# Patient Record
Sex: Female | Born: 1970 | Race: White | Hispanic: No | Marital: Single | Smoking: Former smoker
Health system: Southern US, Community
[De-identification: ages and names within clinical notes are randomized; demographics above are authoritative.]

## PROBLEM LIST (undated history)

## (undated) DIAGNOSIS — J45909 Unspecified asthma, uncomplicated: Secondary | ICD-10-CM

## (undated) HISTORY — DX: Unspecified asthma, uncomplicated: J45.909

---

## 2008-12-06 ENCOUNTER — Ambulatory Visit (HOSPITAL_COMMUNITY): Admission: RE | Admit: 2008-12-06 | Discharge: 2008-12-06 | Payer: Self-pay | Admitting: Obstetrics and Gynecology

## 2009-06-19 ENCOUNTER — Inpatient Hospital Stay (HOSPITAL_COMMUNITY): Admission: RE | Admit: 2009-06-19 | Discharge: 2009-06-21 | Payer: Self-pay | Admitting: Obstetrics and Gynecology

## 2010-02-11 IMAGING — US US OB COMP LESS 14 WK
1 series · 11 of 11 positions shown · non-contrast
Comparison: none

OBSTETRICAL ULTRASOUND:
 This ultrasound was performed in The [HOSPITAL], and the [facility] report will be stored to [REDACTED] PACS.

[Series 1: us ob comp less 14 wk · 11 of 11 slices shown]
[im 1/11]
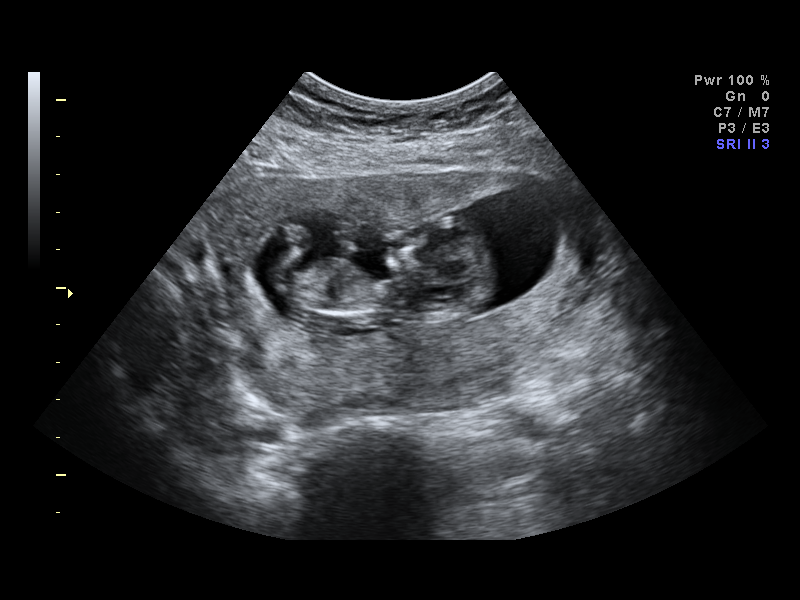
[im 2/11]
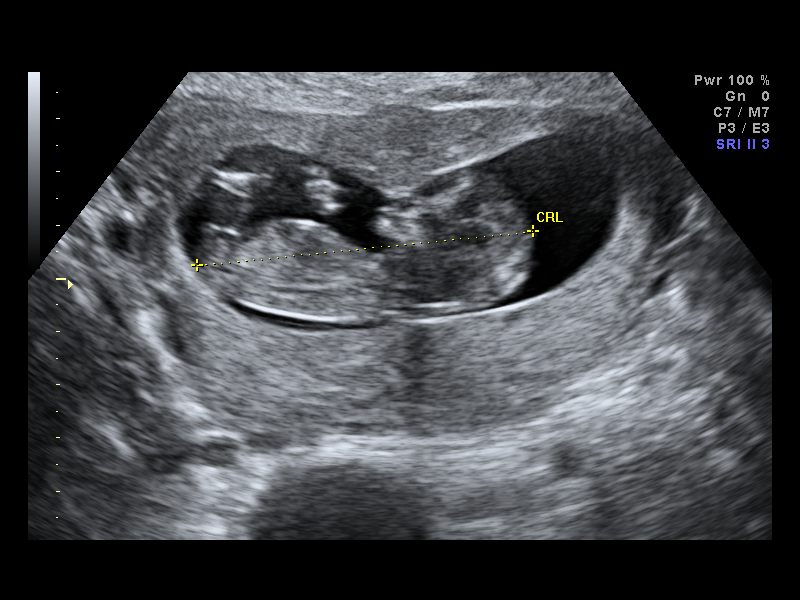
[im 3/11]
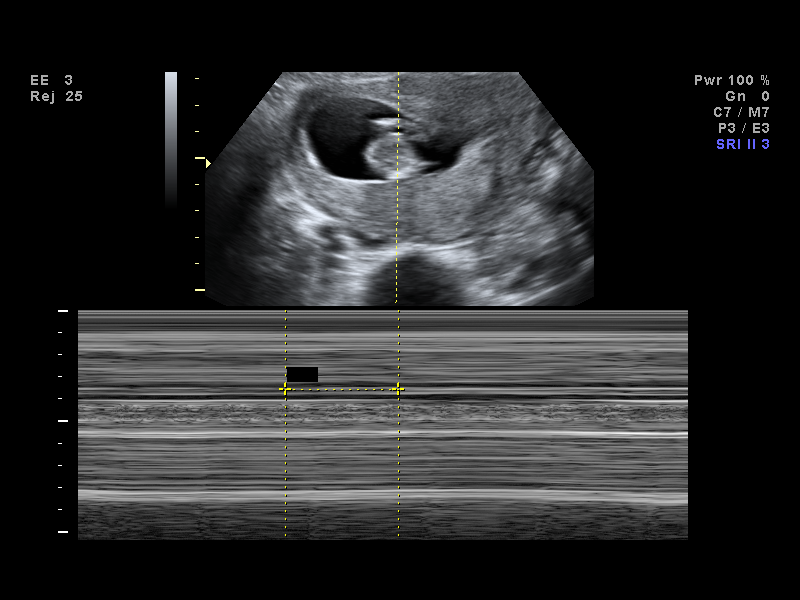
[im 4/11]
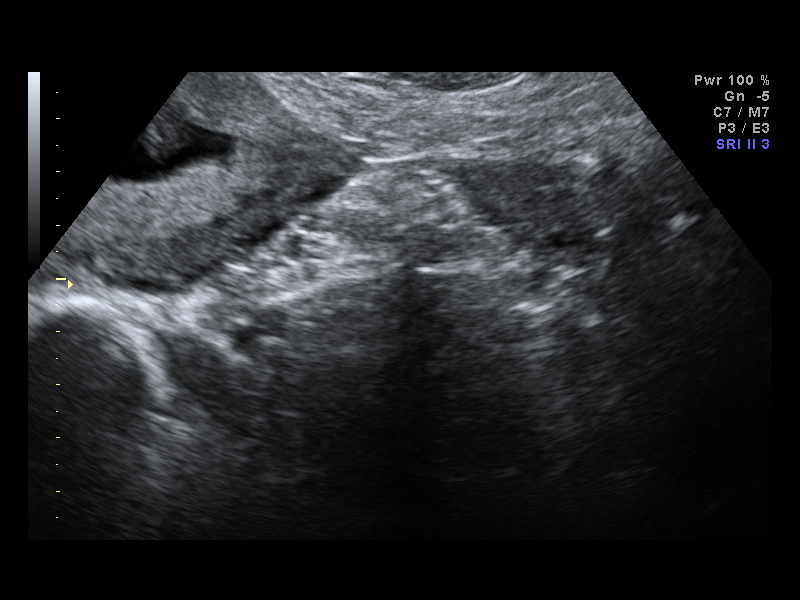
[im 5/11]
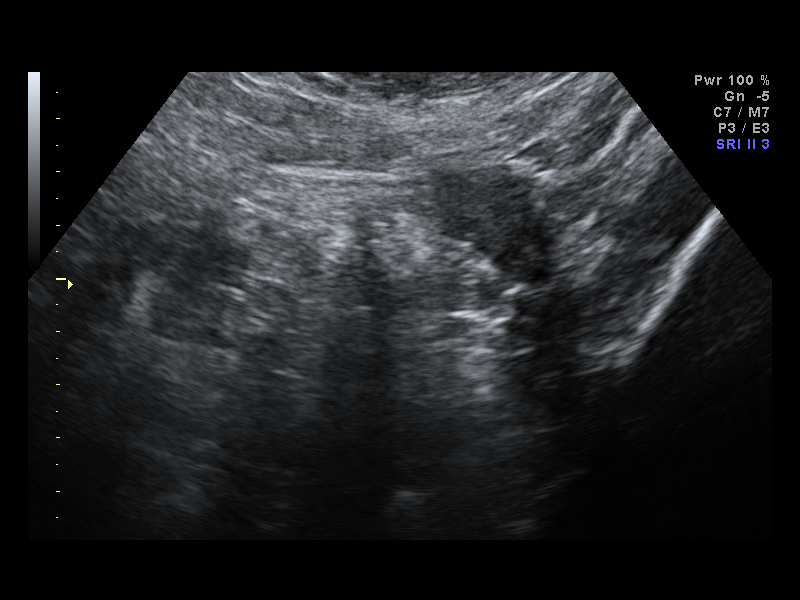
[im 6/11]
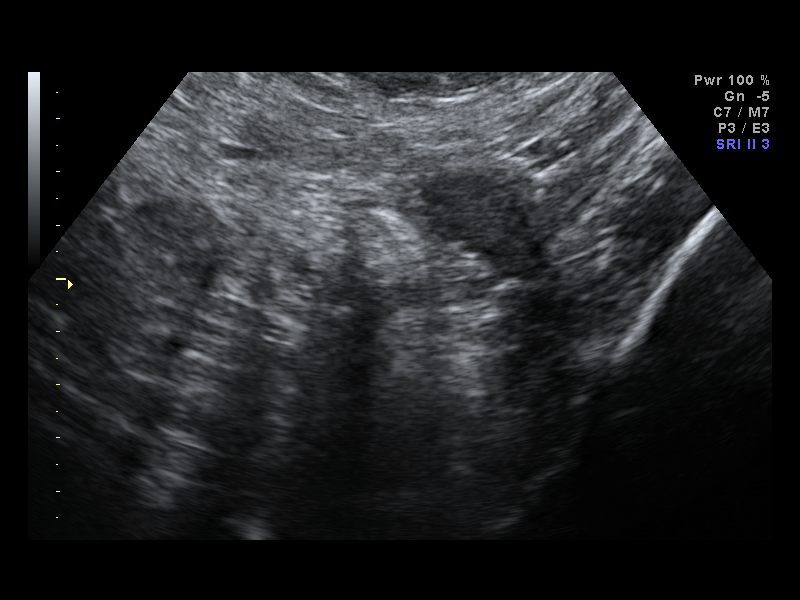
[im 7/11]
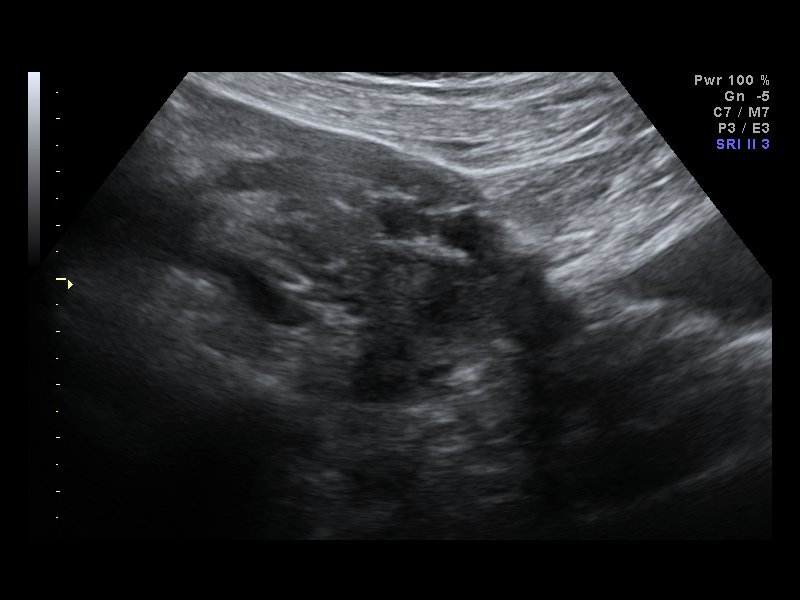
[im 8/11]
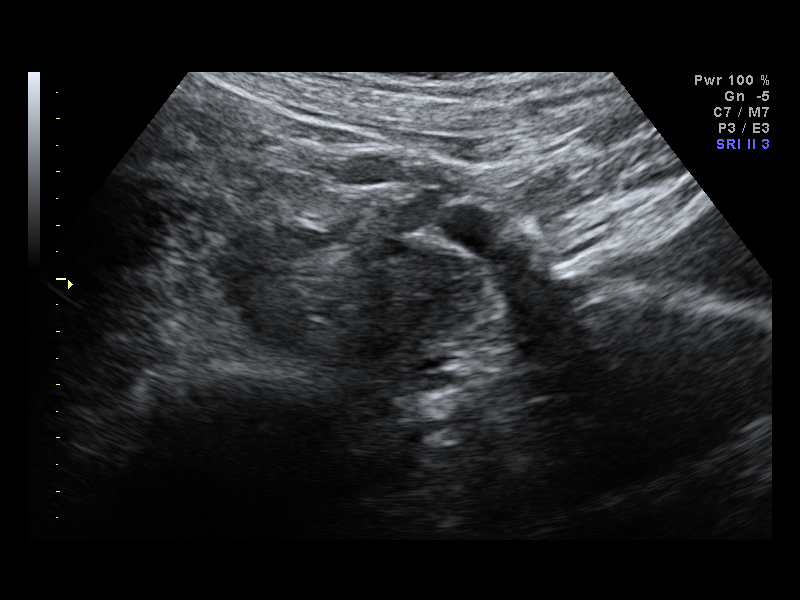
[im 9/11]
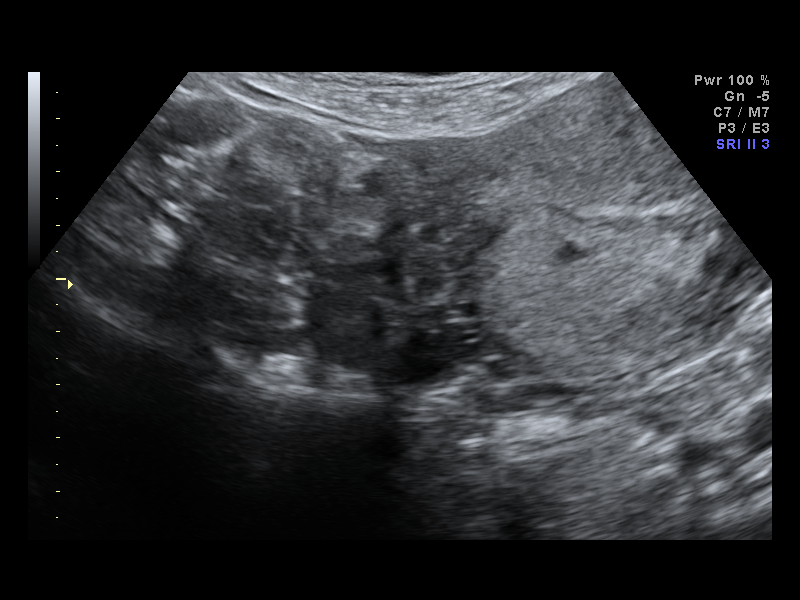
[im 10/11]
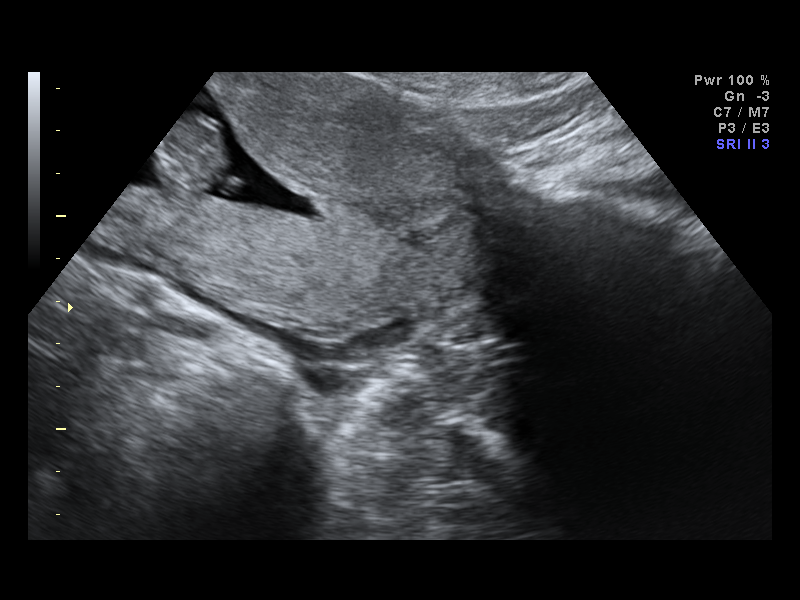
[im 11/11]
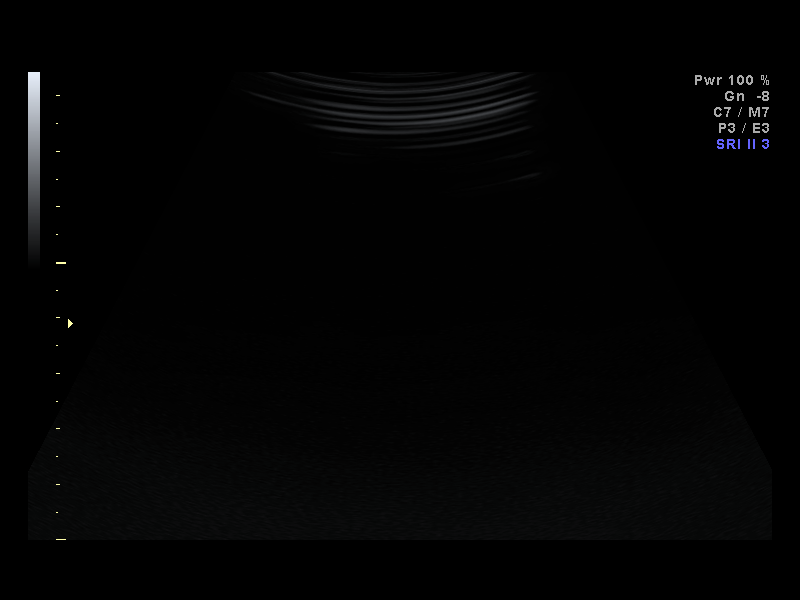

[11 of 11 positions shown; findings below may reference images not displayed]

IMPRESSION: [facility] has also been faxed to the ordering physician.

## 2010-09-01 ENCOUNTER — Encounter: Payer: Self-pay | Admitting: Obstetrics and Gynecology

## 2010-09-02 ENCOUNTER — Encounter: Payer: Self-pay | Admitting: Obstetrics and Gynecology

## 2010-11-13 LAB — CBC
HCT: 32 % — ABNORMAL LOW (ref 36.0–46.0)
Hemoglobin: 9.6 g/dL — ABNORMAL LOW (ref 12.0–15.0)
Platelets: 281 10*3/uL (ref 150–400)
RBC: 3.06 MIL/uL — ABNORMAL LOW (ref 3.87–5.11)
WBC: 10.6 10*3/uL — ABNORMAL HIGH (ref 4.0–10.5)
WBC: 16.8 10*3/uL — ABNORMAL HIGH (ref 4.0–10.5)

## 2010-11-13 LAB — RPR: RPR Ser Ql: NONREACTIVE

## 2015-04-02 DIAGNOSIS — J309 Allergic rhinitis, unspecified: Secondary | ICD-10-CM | POA: Insufficient documentation

## 2015-04-02 DIAGNOSIS — J45909 Unspecified asthma, uncomplicated: Secondary | ICD-10-CM | POA: Insufficient documentation

## 2015-05-15 ENCOUNTER — Ambulatory Visit (INDEPENDENT_AMBULATORY_CARE_PROVIDER_SITE_OTHER): Payer: BLUE CROSS/BLUE SHIELD | Admitting: Pediatrics

## 2015-05-15 ENCOUNTER — Encounter: Payer: Self-pay | Admitting: Pediatrics

## 2015-05-15 VITALS — BP 106/80 | HR 84 | Temp 98.0°F | Resp 16 | Ht 64.57 in | Wt 189.2 lb

## 2015-05-15 DIAGNOSIS — J3089 Other allergic rhinitis: Secondary | ICD-10-CM | POA: Diagnosis not present

## 2015-05-15 DIAGNOSIS — J4530 Mild persistent asthma, uncomplicated: Secondary | ICD-10-CM | POA: Diagnosis not present

## 2015-05-15 MED ORDER — MONTELUKAST SODIUM 10 MG PO TABS: 10.0000 mg | ORAL_TABLET | Freq: Every day | ORAL | Status: DC

## 2015-05-15 MED ORDER — ALBUTEROL SULFATE HFA 108 (90 BASE) MCG/ACT IN AERS: 2.0000 | INHALATION_SPRAY | RESPIRATORY_TRACT | Status: DC | PRN

## 2015-05-15 MED ORDER — FLUTICASONE PROPIONATE 50 MCG/ACT NA SUSP: 2.0000 | Freq: Every day | NASAL | Status: DC

## 2015-05-15 MED ORDER — BECLOMETHASONE DIPROPIONATE 80 MCG/ACT IN AERS: 2.0000 | INHALATION_SPRAY | Freq: Every day | RESPIRATORY_TRACT | Status: DC

## 2015-05-15 NOTE — Assessment & Plan Note (Signed)
Continue on current treatment plan  Follow-up visit in one year but she will call me if she is not doing well on this treatment plan

## 2015-05-15 NOTE — Assessment & Plan Note (Signed)
Continue on the current treatment plan.

## 2015-05-15 NOTE — Progress Notes (Signed)
FOLLOW UP NOTE  RE: Alin Hutchins MRN: [id number] DOB: Nov 17, 1970 [facility] [place] [address] [place] [address] Date of Office Visit: 05/15/2015   Chief Complaint: Cough and Wheezing     HPI the patient has done well in the past year with regard to her asthma and allergic rhinitis. For her asthma she uses Qvar 80 one puff once a day but she increases it to twice a day if she has to use her albuterol inhaler more than 2 days per week. She is now tolerating montelukast 10 mg once a day. She does not want a flu shot  Drug Allergies:  No Active Allergies   Physical Exam: BP 106/80 mmHg  Pulse 84  Temp(Src) 98 F (36.7 C) (Oral)  Resp 16  Ht 5' 4.57" (1.64 m)  Wt 189 lb 2.5 oz (85.8 kg)  BMI 31.90 kg/m2  Physical Exam  Constitutional: She appears well-developed and well-nourished.  HENT:  Right Ear: Tympanic membrane normal.  Left Ear: Tympanic membrane normal.  Nose: Nose normal.  Mouth/Throat: Oropharynx is clear and moist.  Eyes: Conjunctivae are normal.  Neck: Neck supple. No thyromegaly present.  Cardiovascular: Normal rate, regular rhythm and normal heart sounds.   No murmur heard. Pulmonary/Chest: Effort normal and breath sounds normal.  Lymphadenopathy:    She has no cervical adenopathy.  Neurological: She is alert.  Psychiatric: She has a normal mood and affect. Her behavior is normal.     Diagnostics:   Forced vital capacity 2.78 L FEV1 2.04 L predicted forced vital capacity 3.78 L predicted FEV1 3.05 L-the spirometry shows a mild reduction in the forced vital capacity and in the FEV1 percent   Assessment and Plan:  Mild persistent asthma Continue on the current treatment plan.  Other allergic rhinitis Continue on current treatment plan  Follow-up visit in one year but she will call me if she is not doing well on this treatment plan    Return in about 1 year (around  05/14/2016).  Thank you for the opportunity to care for this patient.  Please do not hesitate to contact me with questions.  [facility] [address] [place], [place] [address] [phone]  J. Posey Rea, M.D.

## 2015-05-15 NOTE — Patient Instructions (Signed)
Mild persistent asthma Continue on the current treatment plan.  Other allergic rhinitis Continue on current treatment plan  Follow-up visit in one year but she will call me if she is not doing well on this treatment plan   Return in about 1 year (around 05/14/2016).

## 2016-05-07 ENCOUNTER — Other Ambulatory Visit: Payer: Self-pay | Admitting: Allergy

## 2016-05-08 ENCOUNTER — Other Ambulatory Visit: Payer: Self-pay | Admitting: Allergy

## 2016-05-08 MED ORDER — ALBUTEROL SULFATE HFA 108 (90 BASE) MCG/ACT IN AERS: 2.0000 | INHALATION_SPRAY | RESPIRATORY_TRACT | 1 refills | Status: DC | PRN

## 2016-05-28 ENCOUNTER — Other Ambulatory Visit: Payer: Self-pay | Admitting: Allergy

## 2016-05-28 MED ORDER — FLUTICASONE PROPIONATE 50 MCG/ACT NA SUSP: 2.0000 | Freq: Every day | NASAL | 0 refills | Status: DC

## 2016-05-28 MED ORDER — MONTELUKAST SODIUM 10 MG PO TABS: 10.0000 mg | ORAL_TABLET | Freq: Every day | ORAL | 0 refills | Status: DC

## 2016-06-17 ENCOUNTER — Ambulatory Visit (INDEPENDENT_AMBULATORY_CARE_PROVIDER_SITE_OTHER): Payer: BLUE CROSS/BLUE SHIELD | Admitting: Pediatrics

## 2016-06-17 ENCOUNTER — Encounter: Payer: Self-pay | Admitting: Pediatrics

## 2016-06-17 VITALS — BP 108/82 | HR 78 | Temp 97.8°F | Resp 20

## 2016-06-17 DIAGNOSIS — J3089 Other allergic rhinitis: Secondary | ICD-10-CM | POA: Diagnosis not present

## 2016-06-17 DIAGNOSIS — J4540 Moderate persistent asthma, uncomplicated: Secondary | ICD-10-CM

## 2016-06-17 MED ORDER — MONTELUKAST SODIUM 10 MG PO TABS: 10.0000 mg | ORAL_TABLET | Freq: Every day | ORAL | 5 refills | Status: DC

## 2016-06-17 MED ORDER — ALBUTEROL SULFATE HFA 108 (90 BASE) MCG/ACT IN AERS: 2.0000 | INHALATION_SPRAY | RESPIRATORY_TRACT | 2 refills | Status: DC | PRN

## 2016-06-17 MED ORDER — BECLOMETHASONE DIPROPIONATE 80 MCG/ACT IN AERS: 1.0000 | INHALATION_SPRAY | Freq: Two times a day (BID) | RESPIRATORY_TRACT | 5 refills | Status: DC

## 2016-06-17 NOTE — Progress Notes (Signed)
[address] [place] [address] Dept: [phone]  FOLLOW UP NOTE  Patient ID: Deanna Rubio, female    DOB: 08/01/1971  Age: 45 y.o. MRN: [id number] Date of Office Visit: 06/17/2016  Assessment  Chief Complaint: Allergic Rhinitis ; Asthma; Cough (x's 2-3 weeks); Wheezing (intermittently x's 2-3 weeks); and Nasal Congestion (no color)  HPI Deanna Rubio presents for follow-up of asthma and allergic rhinitis. Her symptoms are worse in the fall of the year. She recently she increased Qvar 80 to  one puff twice a day from one puff once a. She is having mild nasal congestion. She does not want a flu vaccination.  Current medications cetirizine 10 mg once a day,  fluticasone 1 spray per nostril twice a day if needed, montelukast 10 mg once a day Qvar 80-one puff twice a day,  Pro-air 2 puffs every 4 hours if needed. Her other medications are outlined in the chart   Drug Allergies:  No Active Allergies  Physical Exam: BP 108/82 (BP Location: Left Arm, Patient Position: Sitting, Cuff Size: Normal)   Pulse 78   Temp 97.8 F (36.6 C) (Oral)   Resp 20   SpO2 96%    Physical Exam  Constitutional: She is oriented to person, place, and time. She appears well-developed and well-nourished.  HENT:  Eyes normal. Ears normal. Nose normal. Pharynx normal.  Neck: Neck supple.  Cardiovascular:  S1 and S2 normal no murmurs  Pulmonary/Chest:  Clear to percussion and auscultation  Lymphadenopathy:    She has no cervical adenopathy.  Neurological: She is alert and oriented to person, place, and time.  Psychiatric: She has a normal mood and affect. Her behavior is normal. Judgment and thought content normal.  Vitals reviewed.   Diagnostics:  FVC 2.57 L FEV1 2.01 L. Predicted FVC 3.76 L predicted FEV1 3.03 L-this shows a mild reduction in the forced vital capacity  Assessment and Plan: 1. Moderate persistent asthma without complication   2. Other allergic rhinitis     Meds ordered  this encounter  Medications  . beclomethasone (QVAR) 80 MCG/ACT inhaler    Sig: Inhale 1 puff into the lungs 2 (two) times daily.    Dispense:  1 Inhaler    Refill:  5  . albuterol (PROAIR HFA) 108 (90 Base) MCG/ACT inhaler    Sig: Inhale 2 puffs into the lungs every 4 (four) hours as needed for wheezing or shortness of breath.    Dispense:  1 g    Refill:  2    On hold, pt will call  . montelukast (SINGULAIR) 10 MG tablet    Sig: Take 1 tablet (10 mg total) by mouth at bedtime.    Dispense:  30 tablet    Refill:  5    On hold, pt will call    Patient Instructions  Zyrtec 10 mg once a day for runny nose Fluticasone one or 2 sprays per nostril once a day for stuffy nose Qvar 80-1 puff twice a day to prevent coughing or wheezing Pro-air 2 puffs every 4 hours if needed for wheezing or coughing spells Montelukast 10 mg once a day for coughing or wheezing Call me if you are not doing better on this treatment plan Continue on your other medications   Return in about 1 year (around 06/17/2017).    Thank you for the opportunity to care for this patient.  Please do not hesitate to contact me with questions.  Tonette BihariJ. A. Puja Caffey, M.D.  [facility] [address] [place], [place] [address] [phone]

## 2016-06-17 NOTE — Patient Instructions (Addendum)
Zyrtec 10 mg once a day for runny nose Fluticasone one or 2 sprays per nostril once a day for stuffy nose Qvar 80-1 puff twice a day to prevent coughing or wheezing Pro-air 2 puffs every 4 hours if needed for wheezing or coughing spells Montelukast 10 mg once a day for coughing or wheezing Call me if you are not doing better on this treatment plan Continue on your other medications

## 2017-01-22 ENCOUNTER — Other Ambulatory Visit: Payer: Self-pay

## 2017-01-22 MED ORDER — ALBUTEROL SULFATE HFA 108 (90 BASE) MCG/ACT IN AERS: 2.0000 | INHALATION_SPRAY | RESPIRATORY_TRACT | 0 refills | Status: DC | PRN

## 2017-01-22 NOTE — Telephone Encounter (Signed)
Last seen 06/2016. One courtesy refill given today. Will add note to be seen in office for further refills.

## 2017-03-19 ENCOUNTER — Other Ambulatory Visit: Payer: Self-pay | Admitting: *Deleted

## 2017-03-19 MED ORDER — ALBUTEROL SULFATE HFA 108 (90 BASE) MCG/ACT IN AERS: 2.0000 | INHALATION_SPRAY | RESPIRATORY_TRACT | 2 refills | Status: DC | PRN

## 2017-03-19 NOTE — Telephone Encounter (Signed)
According to doctor note, pt is to return in November 2018. Refilled Rx for proair hfa.

## 2017-05-12 ENCOUNTER — Telehealth: Payer: Self-pay | Admitting: Allergy

## 2017-05-12 ENCOUNTER — Other Ambulatory Visit: Payer: Self-pay | Admitting: Allergy

## 2017-05-12 MED ORDER — FLUTICASONE PROPIONATE HFA 110 MCG/ACT IN AERO: 1.0000 | INHALATION_SPRAY | Freq: Two times a day (BID) | RESPIRATORY_TRACT | 5 refills | Status: DC

## 2017-05-12 NOTE — Telephone Encounter (Signed)
Left message for patient that we changed her Q-Var to Flovent HFA 110 mcg

## 2017-06-22 ENCOUNTER — Other Ambulatory Visit: Payer: Self-pay | Admitting: Allergy

## 2017-06-22 MED ORDER — MONTELUKAST SODIUM 10 MG PO TABS: 10.0000 mg | ORAL_TABLET | Freq: Every day | ORAL | 0 refills | Status: DC

## 2017-07-13 ENCOUNTER — Other Ambulatory Visit: Payer: Self-pay

## 2017-07-13 NOTE — Telephone Encounter (Signed)
Denied refill Proair.  Pt last OV 06/17/16.  Patient was given one refill on 01/22/17 and told to make OV.  Also on 03/19/17 was informed to make OV.  NO OV MADE.

## 2017-07-15 ENCOUNTER — Other Ambulatory Visit: Payer: Self-pay | Admitting: Allergy

## 2017-07-15 MED ORDER — ALBUTEROL SULFATE HFA 108 (90 BASE) MCG/ACT IN AERS: 2.0000 | INHALATION_SPRAY | RESPIRATORY_TRACT | 0 refills | Status: DC | PRN

## 2017-07-27 ENCOUNTER — Ambulatory Visit: Payer: BLUE CROSS/BLUE SHIELD | Admitting: Pediatrics

## 2017-07-27 ENCOUNTER — Encounter: Payer: Self-pay | Admitting: Pediatrics

## 2017-07-27 VITALS — BP 122/74 | HR 82 | Temp 97.8°F | Resp 16 | Ht 64.5 in | Wt 196.0 lb

## 2017-07-27 DIAGNOSIS — J4540 Moderate persistent asthma, uncomplicated: Secondary | ICD-10-CM | POA: Diagnosis not present

## 2017-07-27 DIAGNOSIS — J3089 Other allergic rhinitis: Secondary | ICD-10-CM

## 2017-07-27 MED ORDER — ALBUTEROL SULFATE HFA 108 (90 BASE) MCG/ACT IN AERS: 2.0000 | INHALATION_SPRAY | RESPIRATORY_TRACT | 1 refills | Status: DC | PRN

## 2017-07-27 MED ORDER — FLUTICASONE PROPIONATE 50 MCG/ACT NA SUSP: NASAL | 5 refills | Status: DC

## 2017-07-27 MED ORDER — MONTELUKAST SODIUM 10 MG PO TABS: 10.0000 mg | ORAL_TABLET | Freq: Every day | ORAL | 5 refills | Status: DC

## 2017-07-27 MED ORDER — FLUTICASONE PROPIONATE HFA 110 MCG/ACT IN AERO: INHALATION_SPRAY | RESPIRATORY_TRACT | 5 refills | Status: DC

## 2017-07-27 NOTE — Patient Instructions (Signed)
Zyrtec 10 mg once a day if needed for runny nose Fluticasone 2 sprays per nostril once a day if needed for stuffy nose Flovent 110 one puff twice a day to prevent coughing or wheezing Pro-air 2 puffs every 4 hours if needed for wheezing or coughing spells Montelukast  10 mg once a day for coughing or wheezing Complete the course of Augmentin for the  otitis media Call us if you are not doing well on this treatment plan

## 2017-07-27 NOTE — Progress Notes (Signed)
  [address] [place] [address] Dept: [phone]  FOLLOW UP NOTE  Patient ID: Deanna PrimusShawnee Rubio, female    DOB: 11/26/1970  Age: 46 y.o. MRN: [id number] Date of Office Visit: 07/27/2017  Assessment  Chief Complaint: Asthma and Allergies  HPI Deanna PrimusShawnee Riden presents for follow-up of asthma and allergic rhinitis. She was treated for otitis media one week ago with Augmentin and a Medrol Dosepak. Her symptoms have improved. Her asthma has been well controlled. She has been having mild nasal congestion.  Current medications are outlined in the chart   Drug Allergies:  No Known Allergies  Physical Exam: BP 122/74   Pulse 82   Temp 97.8 F (36.6 C) (Oral)   Resp 16   Ht 5' 4.5" (1.638 m)   Wt 196 lb (88.9 kg)   SpO2 95%   BMI 33.12 kg/m    Physical Exam  Constitutional: She is oriented to person, place, and time. She appears well-developed and well-nourished.  HENT:  Eyes normal. Ears normal. Nose normal. Pharynx normal.  Neck: Neck supple. No thyromegaly present.  Cardiovascular:  S1 and S2 normal no murmurs  Pulmonary/Chest:  Clear to percussion and auscultation  Neurological: She is alert and oriented to person, place, and time.  Psychiatric: She has a normal mood and affect. Her behavior is normal. Judgment and thought content normal.  Vitals reviewed.   Diagnostics:  FVC 2.56 L FEV1 1.88 L. Predicted FVC 3.75 L predicted FEV1 3.01 L-this shows a mild reduction in the forced vital capacity  Assessment and Plan: 1. Moderate persistent asthma without complication   2. Other allergic rhinitis     Meds ordered this encounter  Medications  . fluticasone (FLONASE) 50 MCG/ACT nasal spray    Sig: Two sprays each nostril once a day for nasal congestion or drainage.    Dispense:  16 g    Refill:  5  . fluticasone (FLOVENT HFA) 110 MCG/ACT inhaler    Sig: One puff twice a day to prevent cough or wheeze. Rinse, gargle and spit after use.    Dispense:  1 Inhaler      Refill:  5  . albuterol (PROAIR HFA) 108 (90 Base) MCG/ACT inhaler    Sig: Inhale 2 puffs into the lungs every 4 (four) hours as needed for wheezing or shortness of breath.    Dispense:  1 Inhaler    Refill:  1  . montelukast (SINGULAIR) 10 MG tablet    Sig: Take 1 tablet (10 mg total) by mouth at bedtime.    Dispense:  30 tablet    Refill:  5    Patient Instructions  Zyrtec 10 mg once a day if needed for runny nose Fluticasone 2 sprays per nostril once a day if needed for stuffy nose Flovent 110 one puff twice a day to prevent coughing or wheezing Pro-air 2 puffs every 4 hours if needed for wheezing or coughing spells Montelukast  10 mg once a day for coughing or wheezing Complete the course of Augmentin for the  otitis media Call us if you are not doing well on this treatment plan   Return in about 1 year (around 07/27/2018).    Thank you for the opportunity to care for this patient.  Please do not hesitate to contact me with questions.  Tonette BihariJ. A. Tacy Chavis, M.D.  [facility] [address] [place], [place] [address] [phone]

## 2017-09-22 ENCOUNTER — Other Ambulatory Visit: Payer: Self-pay

## 2017-09-22 MED ORDER — ALBUTEROL SULFATE HFA 108 (90 BASE) MCG/ACT IN AERS: 2.0000 | INHALATION_SPRAY | RESPIRATORY_TRACT | 1 refills | Status: DC | PRN

## 2018-05-24 ENCOUNTER — Other Ambulatory Visit: Payer: Self-pay | Admitting: Allergy

## 2018-05-24 MED ORDER — ALBUTEROL SULFATE HFA 108 (90 BASE) MCG/ACT IN AERS: 2.0000 | INHALATION_SPRAY | RESPIRATORY_TRACT | 5 refills | Status: DC | PRN

## 2018-07-27 ENCOUNTER — Ambulatory Visit: Payer: BLUE CROSS/BLUE SHIELD | Admitting: Pediatrics

## 2018-08-02 ENCOUNTER — Ambulatory Visit: Payer: BLUE CROSS/BLUE SHIELD | Admitting: Pediatrics

## 2018-08-10 ENCOUNTER — Ambulatory Visit: Payer: BLUE CROSS/BLUE SHIELD | Admitting: Pediatrics

## 2018-08-10 ENCOUNTER — Encounter: Payer: Self-pay | Admitting: Pediatrics

## 2018-08-10 VITALS — BP 122/66 | HR 71 | Temp 97.6°F | Resp 16 | Ht 64.5 in | Wt 202.2 lb

## 2018-08-10 DIAGNOSIS — J3089 Other allergic rhinitis: Secondary | ICD-10-CM | POA: Diagnosis not present

## 2018-08-10 DIAGNOSIS — E66811 Obesity, class 1: Secondary | ICD-10-CM

## 2018-08-10 DIAGNOSIS — E669 Obesity, unspecified: Secondary | ICD-10-CM | POA: Diagnosis not present

## 2018-08-10 DIAGNOSIS — J4540 Moderate persistent asthma, uncomplicated: Secondary | ICD-10-CM

## 2018-08-10 MED ORDER — FLUTICASONE PROPIONATE 50 MCG/ACT NA SUSP: 2.0000 | Freq: Every day | NASAL | 5 refills | Status: DC | PRN

## 2018-08-10 MED ORDER — ALBUTEROL SULFATE HFA 108 (90 BASE) MCG/ACT IN AERS: 2.0000 | INHALATION_SPRAY | RESPIRATORY_TRACT | 2 refills | Status: DC | PRN

## 2018-08-10 MED ORDER — MONTELUKAST SODIUM 10 MG PO TABS: 10.0000 mg | ORAL_TABLET | Freq: Every day | ORAL | 5 refills | Status: DC

## 2018-08-10 MED ORDER — FLUTICASONE PROPIONATE HFA 110 MCG/ACT IN AERO: INHALATION_SPRAY | RESPIRATORY_TRACT | 5 refills | Status: DC

## 2018-08-10 NOTE — Patient Instructions (Signed)
Zyrtec 10 mg-take 1 tablet once a day if needed for runny nose or itchy eyes Fluticasone- 2 sprays per nostril once a day if needed for stuffy nose Flovent 110-1 puff twice a day to prevent coughing or wheezing Montelukast 10 mg-take 1 tablet once a day to prevent coughing or wheezing Pro-air 2 puffs every 4 hours if needed for wheezing or coughing spells.  You may use Pro-air 2 puffs 5 to 15 minutes before exercise Call us if you are not doing well on this treatment plan

## 2018-08-10 NOTE — Progress Notes (Signed)
[address] [place] [id number] Dept: [phone]  FOLLOW UP NOTE  Patient ID: Deanna Rubio Lippman, female    DOB: 11/01/1970  Age: 47 y.o. MRN: [id number] Date of Office Visit: 08/10/2018  Assessment  Chief Complaint: Asthma and Allergies  HPI Deanna Rubio Monks presents for  follow-up of asthma and allergic rhinitis.  She does not believe in flu vaccinations.  Her asthma has been well controlled with the use of Flovent 110 -1 puff twice a day and montelukast 10 mg once a day.  She is not having to take medications on a daily basis for her nasal symptoms   Drug Allergies:  No Known Allergies  Physical Exam: BP 122/66   Pulse 71   Temp 97.6 F (36.4 C) (Oral)   Resp 16   Ht 5' 4.5" (1.638 m)   Wt 202 lb 3.2 oz (91.7 kg)   SpO2 95%   BMI 34.17 kg/m    Physical Exam Vitals signs reviewed.  Constitutional:      Appearance: Normal appearance. She is obese.  HENT:     Head:     Comments: Eyes normal.  Ears normal.  Nose normal.  Pharynx normal. Neck:     Musculoskeletal: Neck supple.  Cardiovascular:     Comments: S1-S2 normal no murmurs Pulmonary:     Comments: Clear to percussion and auscultation Lymphadenopathy:     Cervical: No cervical adenopathy.  Neurological:     General: No focal deficit present.     Mental Status: She is alert and oriented to person, place, and time.  Psychiatric:        Mood and Affect: Mood normal.        Behavior: Behavior normal.        Thought Content: Thought content normal.        Judgment: Judgment normal.     Diagnostics: FVC 2.62 L FEV1 2.12 L.  Predicted FVC 3.73 L predicted FEV1 2.99 L this shows a mild reduction in the forced vital capacity but stable for her  Assessment and Plan: 1. Moderate persistent asthma without complication   2. Other allergic rhinitis   3. Obesity (BMI 30.0-34.9)     Meds ordered this encounter  Medications  . montelukast (SINGULAIR) 10 MG tablet    Sig: Take 1 tablet (10 mg total) by mouth  at bedtime.    Dispense:  30 tablet    Refill:  5  . fluticasone (FLOVENT HFA) 110 MCG/ACT inhaler    Sig: One puff twice a day to prevent cough or wheeze. Rinse, gargle and spit after use.    Dispense:  1 Inhaler    Refill:  5  . fluticasone (FLONASE) 50 MCG/ACT nasal spray    Sig: Place 2 sprays into both nostrils daily as needed (for stuffy nose).    Dispense:  16 g    Refill:  5  . albuterol (PROAIR HFA) 108 (90 Base) MCG/ACT inhaler    Sig: Inhale 2 puffs into the lungs every 4 (four) hours as needed for wheezing or shortness of breath.    Dispense:  1 Inhaler    Refill:  2    Patient Instructions  Zyrtec 10 mg-take 1 tablet once a day if needed for runny nose or itchy eyes Fluticasone- 2 sprays per nostril once a day if needed for stuffy nose Flovent 110-1 puff twice a day to prevent coughing or wheezing Montelukast 10 mg-take 1 tablet once a day to prevent coughing or wheezing Pro-air 2  puffs every 4 hours if needed for wheezing or coughing spells.  You may use Pro-air 2 puffs 5 to 15 minutes before exercise Call us if you are not doing well on this treatment plan   Return in about 1 year (around 08/11/2019).    Thank you for the opportunity to care for this patient.  Please do not hesitate to contact me with questions.  Tonette BihariJ. A. Destenie Ingber, M.D.  [facility] [address] [place], [place] [address] [phone]

## 2018-10-25 ENCOUNTER — Other Ambulatory Visit: Payer: Self-pay

## 2018-10-25 MED ORDER — MONTELUKAST SODIUM 10 MG PO TABS: 10.0000 mg | ORAL_TABLET | Freq: Every day | ORAL | 5 refills | Status: DC

## 2019-04-21 ENCOUNTER — Other Ambulatory Visit: Payer: Self-pay | Admitting: Pediatrics

## 2019-05-27 ENCOUNTER — Other Ambulatory Visit: Payer: Self-pay | Admitting: *Deleted

## 2019-05-27 MED ORDER — ALBUTEROL SULFATE HFA 108 (90 BASE) MCG/ACT IN AERS: 2.0000 | INHALATION_SPRAY | RESPIRATORY_TRACT | 0 refills | Status: DC | PRN

## 2019-05-27 NOTE — Telephone Encounter (Signed)
Received fax from pharmacy proair not covered- preferred is ventolin. Sent 1 ventolin no refills until ov.

## 2019-06-28 ENCOUNTER — Other Ambulatory Visit: Payer: Self-pay | Admitting: Pediatrics

## 2019-07-13 ENCOUNTER — Other Ambulatory Visit: Payer: Self-pay | Admitting: Pediatrics

## 2019-08-03 ENCOUNTER — Other Ambulatory Visit: Payer: Self-pay | Admitting: Pediatrics

## 2019-08-10 ENCOUNTER — Other Ambulatory Visit: Payer: Self-pay

## 2019-08-11 ENCOUNTER — Other Ambulatory Visit: Payer: Self-pay

## 2019-08-15 ENCOUNTER — Other Ambulatory Visit: Payer: Self-pay

## 2019-08-16 ENCOUNTER — Other Ambulatory Visit: Payer: Self-pay

## 2019-08-16 ENCOUNTER — Ambulatory Visit: Payer: BC Managed Care – PPO | Admitting: Pediatrics

## 2019-08-16 ENCOUNTER — Encounter: Payer: Self-pay | Admitting: Pediatrics

## 2019-08-16 VITALS — BP 110/82 | HR 82 | Temp 96.7°F | Resp 16 | Ht 65.0 in | Wt 206.4 lb

## 2019-08-16 DIAGNOSIS — J4540 Moderate persistent asthma, uncomplicated: Secondary | ICD-10-CM

## 2019-08-16 DIAGNOSIS — J3089 Other allergic rhinitis: Secondary | ICD-10-CM | POA: Diagnosis not present

## 2019-08-16 DIAGNOSIS — E669 Obesity, unspecified: Secondary | ICD-10-CM | POA: Diagnosis not present

## 2019-08-16 MED ORDER — ALBUTEROL SULFATE HFA 108 (90 BASE) MCG/ACT IN AERS: INHALATION_SPRAY | RESPIRATORY_TRACT | 1 refills | Status: DC

## 2019-08-16 MED ORDER — MONTELUKAST SODIUM 10 MG PO TABS: ORAL_TABLET | ORAL | 5 refills | Status: DC

## 2019-08-16 MED ORDER — FLOVENT HFA 110 MCG/ACT IN AERO: INHALATION_SPRAY | RESPIRATORY_TRACT | 5 refills | Status: DC

## 2019-08-16 NOTE — Progress Notes (Signed)
[address] [place] [address] Dept: [phone]  FOLLOW UP NOTE  Patient ID: Deanna Rubio, female    DOB: February 07, 1971  Age: 49 y.o. MRN: [id number] Date of Office Visit: 08/16/2019  Assessment  Chief Complaint: Asthma  HPI [name] presents for follow-up of asthma and allergic rhinitis.  Her asthma is well controlled by using Flovent 110-2 puffs once a day and montelukast 10 mg once a day.  She very rarely has to use albuterol. If she has a runny nose she uses Zyrtec 10 mg once a day and if she has a stuffy nose she uses fluticasone 2 sprays per nostril once a day   Drug Allergies:  No Known Allergies  Physical Exam: BP 110/82   Pulse 82   Temp (!) 96.7 F (35.9 C) (Temporal)   Resp 16   Ht 5\' 5"  (1.651 m)   Wt 206 lb 6.4 oz (93.6 kg)   SpO2 97%   BMI 34.35 kg/m    Physical Exam Constitutional:      Appearance: Normal appearance. She is obese.  HENT:     Head:     Comments: Eyes normal.  Ears normal.  Nose normal.  Pharynx normal. Cardiovascular:     Comments: S1-S2 normal no murmurs Pulmonary:     Comments: Clear to percussion and auscultation Musculoskeletal:     Cervical back: Neck supple.  Lymphadenopathy:     Cervical: No cervical adenopathy.  Neurological:     General: No focal deficit present.     Mental Status: She is alert and oriented to person, place, and time.  Psychiatric:        Mood and Affect: Mood normal.        Behavior: Behavior normal.        Thought Content: Thought content normal.        Judgment: Judgment normal.     Diagnostics: FVC 2.68 L FEV1 2.12 L.  Predicted FVC 3.71 L predicted FEV1 2.96 L-this shows a mild reduction in the forced vital capacity  Assessment and Plan: 1. Moderate persistent asthma without complication   2. Other allergic rhinitis   3. Obesity (BMI 30.0-34.9)     Meds ordered this encounter  Medications  . albuterol (VENTOLIN HFA) 108 (90 Base) MCG/ACT inhaler    Sig: INHALE 2 PUFFS INTO  THE LUNGS EVERY 4 HOURS AS NEEDED FOR WHEEZING OR SHORTNESS OF BREATH    Dispense:  18 g    Refill:  1    Pt needs ov for any additional refills  . fluticasone (FLOVENT HFA) 110 MCG/ACT inhaler    Sig: Two  puff once  a day to prevent cough or wheeze. Rinse, gargle and spit after use.    Dispense:  1 Inhaler    Refill:  5  . montelukast (SINGULAIR) 10 MG tablet    Sig: TAKE 1 TABLET(10 MG) BY MOUTH AT BEDTIME. NEED TO MAKE OFFICE VISIT FOR MORE REFILLS    Dispense:  30 tablet    Refill:  5    Patient Instructions  Zyrtec 10 mg-take 1 tablet once a day if needed for runny nose or itchy eyes Fluticasone 2 sprays per nostril once a day if needed for stuffy nose  Flovent 110-2 puffs once a day to prevent coughing or wheezing Montelukast 10 mg-take 1 tablet once a day to prevent coughing or wheezing Ventolin 2 puffs every 4 hours if needed for wheezing or coughing spells.  You may use Ventolin 2 puffs  5 to 15 minutes before exercise  Continue on your other medications Call us if you are not doing well on this treatment plan   Return in about 1 year (around 08/15/2020).    Thank you for the opportunity to care for this patient.  Please do not hesitate to contact me with questions.  Tonette Bihari, M.D.  [facility] [address] [place], [place] [address] [phone]

## 2019-08-16 NOTE — Patient Instructions (Signed)
Zyrtec 10 mg-take 1 tablet once a day if needed for runny nose or itchy eyes Fluticasone 2 sprays per nostril once a day if needed for stuffy nose  Flovent 110-2 puffs once a day to prevent coughing or wheezing Montelukast 10 mg-take 1 tablet once a day to prevent coughing or wheezing Ventolin 2 puffs every 4 hours if needed for wheezing or coughing spells.  You may use Ventolin 2 puffs 5 to 15 minutes before exercise  Continue on your other medications Call us if you are not doing well on this treatment plan

## 2019-12-05 ENCOUNTER — Other Ambulatory Visit: Payer: Self-pay | Admitting: Pediatrics

## 2020-02-23 ENCOUNTER — Other Ambulatory Visit: Payer: Self-pay | Admitting: Pediatrics

## 2020-02-23 MED ORDER — ALBUTEROL SULFATE HFA 108 (90 BASE) MCG/ACT IN AERS: INHALATION_SPRAY | RESPIRATORY_TRACT | 1 refills | Status: DC

## 2020-02-23 MED ORDER — MONTELUKAST SODIUM 10 MG PO TABS: ORAL_TABLET | ORAL | 5 refills | Status: DC

## 2020-02-23 NOTE — Telephone Encounter (Signed)
Patient is in need of refills for Ventolin and Montelukast

## 2020-02-23 NOTE — Telephone Encounter (Signed)
meds sent

## 2020-05-23 ENCOUNTER — Other Ambulatory Visit: Payer: Self-pay | Admitting: Family Medicine

## 2020-09-18 NOTE — Progress Notes (Signed)
Follow Up Note  RE: Deanna Rubio MRN: [id number] DOB: May 12, 1971 Date of Office Visit: 09/19/2020  Referring provider: Harold Hedge, MD Primary care provider: Harold Hedge, MD  Chief Complaint: Asthma  History of Present Illness: I had the pleasure of seeing Deanna Rubio for a follow up visit at the [facility] on 09/19/2020. She is a 50 y.o. female, who is being followed for allergic rhinitis and asthma. Her previous allergy office visit was on 08/16/2019 with Dr. Beaulah Dinning. Today is a regular follow up visit.  Allergic rhinitis: Currently taking zyrtec and Singulair daily and doing well on it. Using Flonase as needed when she forgets to take the above medications due to increased nasal congestion. Symptoms worse in the fall time. 1 dog at home.   Asthma: Currently on Flovent 2 puffs once a day.  Using albuterol a few times per week for exertion related or increased pet dander exposure.  Denies any ER/urgent care visits or prednisone use since the last visit.  Patient had Covid-19 in November 2021 - doing much better now. Took Flovent BID during URI.  Assessment and Plan: Deanna Rubio is a 50 y.o. female with: Mild persistent asthma Doing well with below regimen. Had Covid-19 in December 2021  and had to use Flovent a little more frequently. Triggers include exertion and increased pet dander.  ACT score 21.  Today's spirometry showed some mild restriction most likely due to body habitus. . Daily controller medication(s): continue Flovent 2 puffs once a day  and rinse mouth afterwards. . Continue Singulair (montelukast) 10mg  daily at night. . During upper respiratory infections/asthma flares: start Flovent 2 puffs twice a day for 1-2 weeks until your breathing symptoms return to baseline.  . May use albuterol rescue inhaler 2 puffs every 4 to 6 hours as needed for shortness of breath, chest tightness, coughing, and wheezing. May use  albuterol rescue inhaler 2 puffs 5 to 15 minutes prior to strenuous physical activities. Monitor frequency of use. . Repeat spirometry at next visit.   Other allergic rhinitis Symptoms controlled with Zyrtec and Singulair daily.  No recent allergy testing.  May use over the counter antihistamines such as Zyrtec (cetirizine) 10mg  daily as needed. May take twice a day if needed.   Continue Singulair (montelukast) 10mg  daily at night.  May use Flonase (fluticasone) nasal spray 1 spray per nostril twice a day as needed for nasal congestion.   Nasal saline spray (i.e., Simply Saline) or nasal saline lavage (i.e., NeilMed) is recommended as needed and prior to medicated nasal sprays.  Continue environmental control measures.   If symptoms not controlled with above regimen then recommend retesting and starting allergy immunotherapy.  Return in about 1 year (around 09/19/2021).  Meds ordered this encounter  Medications  . albuterol (VENTOLIN HFA) 108 (90 Base) MCG/ACT inhaler    Sig: Inhale 2 puffs into the lungs every 4 (four) hours as needed for wheezing or shortness of breath (coughing).    Dispense:  18 g    Refill:  1  . fluticasone (FLONASE) 50 MCG/ACT nasal spray    Sig: Place 1-2 sprays into both nostrils daily as needed for rhinitis.    Dispense:  16 g    Refill:  5  . montelukast (SINGULAIR) 10 MG tablet    Sig: Take 1 tablet (10 mg total) by mouth at bedtime.    Dispense:  90 tablet    Refill:  3  . fluticasone (FLOVENT HFA)  110 MCG/ACT inhaler    Sig: Inhale 2 puffs into the lungs daily. Rinse mouth after each use    Dispense:  1 each    Refill:  5   Diagnostics: Spirometry:  Tracings reviewed. Her effort: Good reproducible efforts. FVC: 2.49L FEV1: 1.95L, 71% predicted FEV1/FVC ratio: 78% Interpretation: Spirometry consistent with possible restrictive disease.  Please see scanned spirometry results for details.  Medication List:  Current Outpatient Medications   Medication Sig Dispense Refill  . cetirizine (ZYRTEC) 10 MG tablet Take 10 mg by mouth daily as needed.     . fluticasone (FLOVENT HFA) 110 MCG/ACT inhaler Inhale 2 puffs into the lungs daily. Rinse mouth after each use 1 each 5  . montelukast (SINGULAIR) 10 MG tablet Take 1 tablet (10 mg total) by mouth at bedtime. 90 tablet 3  . tetrahydrozoline-zinc (VISINE-AC) 0.05-0.25 % ophthalmic solution Place 1 drop into both eyes 3 (three) times daily as needed.    Marland Kitchen albuterol (VENTOLIN HFA) 108 (90 Base) MCG/ACT inhaler Inhale 2 puffs into the lungs every 4 (four) hours as needed for wheezing or shortness of breath (coughing). 18 g 1  . fluticasone (FLONASE) 50 MCG/ACT nasal spray Place 1-2 sprays into both nostrils daily as needed for rhinitis. 16 g 5   No current facility-administered medications for this visit.   Allergies: No Known Allergies I reviewed her past medical history, social history, family history, and environmental history and no significant changes have been reported from her previous visit.  Review of Systems  Constitutional: Negative for appetite change, chills, fever and unexpected weight change.  HENT: Negative for congestion and rhinorrhea.   Eyes: Negative for itching.  Respiratory: Negative for cough, chest tightness, shortness of breath and wheezing.   Gastrointestinal: Negative for abdominal pain.  Skin: Negative for rash.  Allergic/Immunologic: Positive for environmental allergies.  Neurological: Negative for headaches.   Objective: BP 104/76   Pulse 70   Temp 98.7 F (37.1 C) (Temporal)   Resp 20   Ht 5\' 3"  (1.6 m)   Wt 207 lb (93.9 kg)   SpO2 98%   BMI 36.67 kg/m  Body mass index is 36.67 kg/m. Physical Exam Vitals and nursing note reviewed.  Constitutional:      Appearance: Normal appearance. She is well-developed.  HENT:     Head: Normocephalic and atraumatic.     Right Ear: External ear normal.     Left Ear: External ear normal.     Nose: Nose  normal.     Mouth/Throat:     Mouth: Mucous membranes are moist.     Pharynx: Oropharynx is clear.  Eyes:     Conjunctiva/sclera: Conjunctivae normal.  Cardiovascular:     Rate and Rhythm: Normal rate and regular rhythm.     Heart sounds: Normal heart sounds. No murmur heard.   Pulmonary:     Effort: Pulmonary effort is normal.     Breath sounds: Normal breath sounds. No wheezing, rhonchi or rales.  Musculoskeletal:     Cervical back: Neck supple.  Skin:    General: Skin is warm.     Findings: No rash.  Neurological:     Mental Status: She is alert and oriented to person, place, and time.  Psychiatric:        Behavior: Behavior normal.    Previous notes and tests were reviewed. The plan was reviewed with the patient/family, and all questions/concerned were addressed.  It was my pleasure to see Deanna Rubio today and participate in  her care. Please feel free to contact me with any questions or concerns.  Sincerely,  Wyline Mood, DO Allergy & Immunology  [facility] office: [phone] [facility] office: [phone]

## 2020-09-19 ENCOUNTER — Encounter: Payer: Self-pay | Admitting: Allergy

## 2020-09-19 ENCOUNTER — Ambulatory Visit: Payer: BC Managed Care – PPO | Admitting: Allergy

## 2020-09-19 ENCOUNTER — Other Ambulatory Visit: Payer: Self-pay

## 2020-09-19 VITALS — BP 104/76 | HR 70 | Temp 98.7°F | Resp 20 | Ht 63.0 in | Wt 207.0 lb

## 2020-09-19 DIAGNOSIS — J4530 Mild persistent asthma, uncomplicated: Secondary | ICD-10-CM | POA: Diagnosis not present

## 2020-09-19 DIAGNOSIS — J3089 Other allergic rhinitis: Secondary | ICD-10-CM | POA: Diagnosis not present

## 2020-09-19 DIAGNOSIS — J4540 Moderate persistent asthma, uncomplicated: Secondary | ICD-10-CM

## 2020-09-19 MED ORDER — FLOVENT HFA 110 MCG/ACT IN AERO: 2.0000 | INHALATION_SPRAY | Freq: Every day | RESPIRATORY_TRACT | 5 refills | Status: DC

## 2020-09-19 MED ORDER — FLUTICASONE PROPIONATE 50 MCG/ACT NA SUSP: 1.0000 | Freq: Every day | NASAL | 5 refills | Status: DC | PRN

## 2020-09-19 MED ORDER — ALBUTEROL SULFATE HFA 108 (90 BASE) MCG/ACT IN AERS: 2.0000 | INHALATION_SPRAY | RESPIRATORY_TRACT | 1 refills | Status: DC | PRN

## 2020-09-19 MED ORDER — MONTELUKAST SODIUM 10 MG PO TABS: 10.0000 mg | ORAL_TABLET | Freq: Every day | ORAL | 3 refills | Status: DC

## 2020-09-19 NOTE — Assessment & Plan Note (Signed)
Symptoms controlled with Zyrtec and Singulair daily.  No recent allergy testing.  May use over the counter antihistamines such as Zyrtec (cetirizine) 10mg  daily as needed. May take twice a day if needed.   Continue Singulair (montelukast) 10mg  daily at night.  May use Flonase (fluticasone) nasal spray 1 spray per nostril twice a day as needed for nasal congestion.   Nasal saline spray (i.e., Simply Saline) or nasal saline lavage (i.e., NeilMed) is recommended as needed and prior to medicated nasal sprays.  Continue environmental control measures.   If symptoms not controlled with above regimen then recommend retesting and starting allergy immunotherapy.

## 2020-09-19 NOTE — Patient Instructions (Addendum)
Allergic rhinitis:  May use over the counter antihistamines such as Zyrtec (cetirizine) 10mg  daily as needed. May take twice a day if needed.   Continue Singulair (montelukast) 10mg  daily at night.  May use Flonase (fluticasone) nasal spray 1 spray per nostril twice a day as needed for nasal congestion.   Nasal saline spray (i.e., Simply Saline) or nasal saline lavage (i.e., NeilMed) is recommended as needed and prior to medicated nasal sprays.  Continue environmental control measures.   Asthma: . Daily controller medication(s): continue Flovent 2 puffs once a day  and rinse mouth afterwards. . Continue Singulair (montelukast) 10mg  daily at night. . During upper respiratory infections/asthma flares: start Flovent 2 puffs twice a day for 1-2 weeks until your breathing symptoms return to baseline.  . May use albuterol rescue inhaler 2 puffs every 4 to 6 hours as needed for shortness of breath, chest tightness, coughing, and wheezing. May use albuterol rescue inhaler 2 puffs 5 to 15 minutes prior to strenuous physical activities. Monitor frequency of use.  . Asthma control goals:  o Full participation in all desired activities (may need albuterol before activity) o Albuterol use two times or less a week on average (not counting use with activity) o Cough interfering with sleep two times or less a month o Oral steroids no more than once a year o No hospitalizations  Follow up in 1 year or sooner if needed.  Reducing Pollen Exposure . Pollen seasons: trees (spring), grass (summer) and ragweed/weeds (fall). Keep windows closed in your home and car to lower pollen exposure.  08-16-1982 air conditioning in the bedroom and throughout the house if possible.  . Avoid going out in dry windy days - especially early morning. . Pollen counts are highest between 5 - 10 AM and on dry, hot and windy days.  . Save outside activities for late afternoon or after a heavy rain, when pollen  levels are lower.  . Avoid mowing of grass if you have grass pollen allergy. 12-18-1980 Be aware that pollen can also be transported indoors on people and pets.  . Dry your clothes in an automatic dryer rather than hanging them outside where they might collect pollen.  . Rinse hair and eyes before bedtime.  Pet Allergen Avoidance: . Contrary to popular opinion, there are no "hypoallergenic" breeds of dogs or cats. That is because people are not allergic to an animal's hair, but to an allergen found in the animal's saliva, dander (dead skin flakes) or urine. Pet allergy symptoms typically occur within minutes. For some people, symptoms can build up and become most severe 8 to 12 hours after contact with the animal. People with severe allergies can experience reactions in public places if dander has been transported on the pet owners' clothing. Marland Kitchen Keeping an animal outdoors is only a partial solution, since homes with pets in the yard still have higher concentrations of animal allergens. . Before getting a pet, ask your allergist to determine if you are allergic to animals. If your pet is already considered part of your family, try to minimize contact and keep the pet out of the bedroom and other rooms where you spend a great deal of time. . As with dust mites, vacuum carpets often or replace carpet with a hardwood floor, tile or linoleum. . High-efficiency particulate air (HEPA) cleaners can reduce allergen levels over time. . While dander and saliva are the source of cat and dog allergens, urine is the source of  allergens from rabbits, hamsters, mice and [place] pigs; so ask a non-allergic family member to clean the animal's cage. . If you have a pet allergy, talk to your allergist about the potential for allergy immunotherapy (allergy shots). This strategy can often provide long-term relief.

## 2020-09-19 NOTE — Assessment & Plan Note (Signed)
Doing well with below regimen. Had Covid-19 in December 2021  and had to use Flovent a little more frequently. Triggers include exertion and increased pet dander.  ACT score 21.  Today's spirometry showed some mild restriction most likely due to body habitus. . Daily controller medication(s): continue Flovent 2 puffs once a day  and rinse mouth afterwards. . Continue Singulair (montelukast) 10mg  daily at night. . During upper respiratory infections/asthma flares: start Flovent 2 puffs twice a day for 1-2 weeks until your breathing symptoms return to baseline.  . May use albuterol rescue inhaler 2 puffs every 4 to 6 hours as needed for shortness of breath, chest tightness, coughing, and wheezing. May use albuterol rescue inhaler 2 puffs 5 to 15 minutes prior to strenuous physical activities. Monitor frequency of use. . Repeat spirometry at next visit.

## 2021-02-23 ENCOUNTER — Other Ambulatory Visit: Payer: Self-pay | Admitting: Allergy

## 2021-06-10 ENCOUNTER — Other Ambulatory Visit: Payer: Self-pay | Admitting: Allergy

## 2021-09-27 ENCOUNTER — Other Ambulatory Visit: Payer: Self-pay | Admitting: Allergy

## 2021-10-01 ENCOUNTER — Other Ambulatory Visit: Payer: Self-pay | Admitting: Allergy

## 2021-10-01 ENCOUNTER — Telehealth: Payer: Self-pay | Admitting: Allergy

## 2021-10-01 MED ORDER — MONTELUKAST SODIUM 10 MG PO TABS: 10.0000 mg | ORAL_TABLET | Freq: Every day | ORAL | 0 refills | Status: DC

## 2021-10-01 NOTE — Telephone Encounter (Signed)
Sent in 1 curtesy refill 

## 2021-10-01 NOTE — Telephone Encounter (Signed)
Patient is asking for a refill on montelukast (SINGULAIR) 10 MG tablet [[id number]]  patient has an appointment on march 8th with Dr. Selena Batten please advise

## 2021-10-15 NOTE — Progress Notes (Signed)
? ?Follow Up Note ? ?RE: Deanna Rubio MRN: [id number] DOB: Sep 02, 1970 ?Date of Office Visit: 10/16/2021 ? ?Referring provider: Harold Hedge, MD ?Primary care provider: Harold Hedge, MD ? ?Chief Complaint: Asthma ? ?History of Present Illness: ?I had the pleasure of seeing Deanna Rubio for a follow up visit at the [facility] on 10/16/2021. She is a 51 y.o. female, who is being followed for asthma, allergic rhinitis. Her previous allergy office visit was on 09/19/2020 with Dr. Selena Batten. Today is a regular follow up visit. ? ?Asthma ?Patient had Covid-19 in January 2023 - this is the second time had Covid-19 and symptoms were mild.  ?Currently using Flovent 2 puffs once a day and uses twice a day during URIs and spring season.  ? ?Noted some wheezing in the mornings.  ?Using albuterol prior to exertion. ?No prednisone since the last visit.  ? ?Allergic rhinitis ?Taking zyrtec daily and Flonase as needed. Having some nasal congestion right now.  ? ?Assessment and Plan: ?Deanna Rubio is a 51 y.o. female with: ?Mild persistent asthma ?Past history - Covid-19 in December 2021. Triggers include exertion and increased pet dander. ?Interim history - Covid-19 in January 2023. Noted some wheezing in the mornings.  ?Today's spirometry showed some moderate restriction. ?Daily controller medication(s): START Aidruo 1 puff twice a day and rinse mouth after each use - this replaces Flovent and take for 2 months then go back to Flovent 1 puffs 1-2 times a day with spacer and rinse mouth afterwards. ?Continue Singulair (montelukast) 10mg  daily at night. ?May use albuterol rescue inhaler 2 puffs every 4 to 6 hours as needed for shortness of breath, chest tightness, coughing, and wheezing. May use albuterol rescue inhaler 2 puffs 5 to 15 minutes prior to strenuous physical activities. Monitor frequency of use.  ?Get spirometry at next visit. ? ?Other allergic rhinitis ?Some nasal congestion. Only using  nasal spray prn.  ?May use over the counter antihistamines such as Zyrtec (cetirizine) 10mg  daily as needed. May take twice a day if needed.  ?Continue Singulair (montelukast) 10mg  daily at night. ?Use Flonase (fluticasone) nasal spray 1 spray per nostril twice a day as needed for nasal congestion.  ?Nasal saline spray (i.e., Simply Saline) or nasal saline lavage (i.e., NeilMed) is recommended as needed and prior to medicated nasal sprays. ?Continue environmental control measures.  ?If symptoms not controlled with above regimen then recommend retesting and starting allergy immunotherapy. ? ?Return in about 3 months (around 01/16/2022). ? ?Meds ordered this encounter  ?Medications  ? cetirizine (ZYRTEC) 10 MG tablet  ?  Sig: Take 1 tablet (10 mg total) by mouth daily as needed.  ?  Dispense:  90 tablet  ?  Refill:  3  ? fluticasone (FLONASE) 50 MCG/ACT nasal spray  ?  Sig: Place 1-2 sprays into both nostrils daily as needed for rhinitis.  ?  Dispense:  16 g  ?  Refill:  5  ? Fluticasone-Salmeterol,sensor, (AIRDUO [place]) 113-14 MCG/ACT AEPB  ?  Sig: Inhale 1 puff into the lungs in the morning and at bedtime. Rinse mouth after each use.  ?  Dispense:  1 each  ?  Refill:  3  ?  [phone]  ? montelukast (SINGULAIR) 10 MG tablet  ?  Sig: Take 1 tablet (10 mg total) by mouth at bedtime.  ?  Dispense:  90 tablet  ?  Refill:  3  ? albuterol (VENTOLIN HFA) 108 (90 Base) MCG/ACT inhaler  ?  Sig: Inhale 2 puffs into the lungs every 4 (four) hours as needed for wheezing or shortness of breath (coughing fits).  ?  Dispense:  18 g  ?  Refill:  1  ? ?Lab Orders  ?No laboratory test(s) ordered today  ? ? ?Diagnostics: ?Spirometry:  ?Tracings reviewed. Her effort: Good reproducible efforts. ?FVC: 2.42L ?FEV1: 1.88L, 68% predicted ?FEV1/FVC ratio: 78% ?Interpretation: Spirometry consistent with moderate restrictive disease.  ?Please see scanned spirometry results for details. ? ?Medication List:  ?Current Outpatient Medications   ?Medication Sig Dispense Refill  ? albuterol (VENTOLIN HFA) 108 (90 Base) MCG/ACT inhaler Inhale 2 puffs into the lungs every 4 (four) hours as needed for wheezing or shortness of breath (coughing fits). 18 g 1  ? fluticasone (FLOVENT HFA) 110 MCG/ACT inhaler Inhale 2 puffs into the lungs daily. Rinse mouth after each use (Patient taking differently: Inhale 2 puffs into the lungs daily as needed. Rinse mouth after each use) 1 each 5  ? Fluticasone-Salmeterol,sensor, (AIRDUO [place]) 113-14 MCG/ACT AEPB Inhale 1 puff into the lungs in the morning and at bedtime. Rinse mouth after each use. 1 each 3  ? montelukast (SINGULAIR) 10 MG tablet Take 1 tablet (10 mg total) by mouth at bedtime. 90 tablet 3  ? cetirizine (ZYRTEC) 10 MG tablet Take 1 tablet (10 mg total) by mouth daily as needed. 90 tablet 3  ? fluticasone (FLONASE) 50 MCG/ACT nasal spray Place 1-2 sprays into both nostrils daily as needed for rhinitis. 16 g 5  ? tetrahydrozoline-zinc (VISINE-AC) 0.05-0.25 % ophthalmic solution Place 1 drop into both eyes 3 (three) times daily as needed. (Patient not taking: Reported on 10/16/2021)    ? ?No current facility-administered medications for this visit.  ? ?Allergies: ?No Known Allergies ?I reviewed her past medical history, social history, family history, and environmental history and no significant changes have been reported from her previous visit. ? ?Review of Systems  ?Constitutional:  Negative for appetite change, chills, fever and unexpected weight change.  ?HENT:  Positive for congestion. Negative for rhinorrhea.   ?Eyes:  Negative for itching.  ?Respiratory:  Positive for wheezing. Negative for cough, chest tightness and shortness of breath.   ?Gastrointestinal:  Negative for abdominal pain.  ?Skin:  Negative for rash.  ?Allergic/Immunologic: Positive for environmental allergies.  ?Neurological:  Negative for headaches.  ? ?Objective: ?BP 126/82   Pulse 86   Temp 98.1 ?F (36.7 ?C) (Temporal)   Resp 16    Ht 5\' 4"  (1.626 m)   Wt 209 lb 1.6 oz (94.8 kg)   SpO2 96%   BMI 35.89 kg/m?  ?Body mass index is 35.89 kg/m?Marland Kitchen ?Physical Exam ?Vitals and nursing note reviewed.  ?Constitutional:   ?   Appearance: Normal appearance. She is well-developed.  ?HENT:  ?   Head: Normocephalic and atraumatic.  ?   Right Ear: External ear normal.  ?   Left Ear: External ear normal.  ?   Nose: Nose normal.  ?   Mouth/Throat:  ?   Mouth: Mucous membranes are moist.  ?   Pharynx: Oropharynx is clear.  ?Eyes:  ?   Conjunctiva/sclera: Conjunctivae normal.  ?Cardiovascular:  ?   Rate and Rhythm: Normal rate and regular rhythm.  ?   Heart sounds: Normal heart sounds. No murmur heard. ?Pulmonary:  ?   Effort: Pulmonary effort is normal.  ?   Breath sounds: Normal breath sounds. No wheezing, rhonchi or rales.  ?Musculoskeletal:  ?   Cervical back: Neck supple.  ?  Skin: ?   General: Skin is warm.  ?   Findings: No rash.  ?Neurological:  ?   Mental Status: She is alert and oriented to person, place, and time.  ?Psychiatric:     ?   Behavior: Behavior normal.  ? ?Previous notes and tests were reviewed. ?The plan was reviewed with the patient/family, and all questions/concerned were addressed. ? ?It was my pleasure to see Deanna Rubio today and participate in her care. Please feel free to contact me with any questions or concerns. ? ?Sincerely, ? ?Wyline Mood, DO ?Allergy & Immunology ? ?[facility] ?[place] office: [phone] ?[place] office: [phone] ?

## 2021-10-16 ENCOUNTER — Ambulatory Visit: Payer: BC Managed Care – PPO | Admitting: Allergy

## 2021-10-16 ENCOUNTER — Encounter: Payer: Self-pay | Admitting: Allergy

## 2021-10-16 ENCOUNTER — Other Ambulatory Visit: Payer: Self-pay

## 2021-10-16 VITALS — BP 126/82 | HR 86 | Temp 98.1°F | Resp 16 | Ht 64.0 in | Wt 209.1 lb

## 2021-10-16 DIAGNOSIS — J3089 Other allergic rhinitis: Secondary | ICD-10-CM | POA: Diagnosis not present

## 2021-10-16 DIAGNOSIS — J4530 Mild persistent asthma, uncomplicated: Secondary | ICD-10-CM | POA: Diagnosis not present

## 2021-10-16 MED ORDER — AIRDUO DIGIHALER 113-14 MCG/ACT IN AEPB: 1.0000 | INHALATION_SPRAY | Freq: Two times a day (BID) | RESPIRATORY_TRACT | 3 refills | Status: DC

## 2021-10-16 MED ORDER — FLUTICASONE PROPIONATE 50 MCG/ACT NA SUSP: 1.0000 | Freq: Every day | NASAL | 5 refills | Status: DC | PRN

## 2021-10-16 MED ORDER — CETIRIZINE HCL 10 MG PO TABS: 10.0000 mg | ORAL_TABLET | Freq: Every day | ORAL | 3 refills | Status: DC | PRN

## 2021-10-16 MED ORDER — ALBUTEROL SULFATE HFA 108 (90 BASE) MCG/ACT IN AERS: 2.0000 | INHALATION_SPRAY | RESPIRATORY_TRACT | 1 refills | Status: DC | PRN

## 2021-10-16 MED ORDER — MONTELUKAST SODIUM 10 MG PO TABS: 10.0000 mg | ORAL_TABLET | Freq: Every day | ORAL | 3 refills | Status: DC

## 2021-10-16 NOTE — Patient Instructions (Addendum)
Allergic rhinitis: ?May use over the counter antihistamines such as Zyrtec (cetirizine) 10mg  daily as needed. May take twice a day if needed.  ?Continue Singulair (montelukast) 10mg  daily at night. ?Use Flonase (fluticasone) nasal spray 1 spray per nostril twice a day as needed for nasal congestion.  ?Nasal saline spray (i.e., Simply Saline) or nasal saline lavage (i.e., NeilMed) is recommended as needed and prior to medicated nasal sprays. ?Continue environmental control measures.  ? ?Asthma: ?Daily controller medication(s): continue Flovent 2 puffs 1-2 times a day and rinse mouth afterwards. ?Once you get your new inhaler: ?START Aidruo 1 puff twice a day and rinse mouth after each use - this replaces Flovent and take for 2 months then go back to Flovent.  ?Continue Singulair (montelukast) 10mg  daily at night. ?May use albuterol rescue inhaler 2 puffs every 4 to 6 hours as needed for shortness of breath, chest tightness, coughing, and wheezing. May use albuterol rescue inhaler 2 puffs 5 to 15 minutes prior to strenuous physical activities. Monitor frequency of use.  ?Asthma control goals:  ?Full participation in all desired activities (may need albuterol before activity) ?Albuterol use two times or less a week on average (not counting use with activity) ?Cough interfering with sleep two times or less a month ?Oral steroids no more than once a year ?No hospitalizations ? ?Follow up in 3 months or sooner if needed. ? ?Reducing Pollen Exposure ?Pollen seasons: trees (spring), grass (summer) and ragweed/weeds (fall). ?Keep windows closed in your home and car to lower pollen exposure.  ?Install air conditioning in the bedroom and throughout the house if possible.  ?Avoid going out in dry windy days - especially early morning. ?Pollen counts are highest between 5 - 10 AM and on dry, hot and windy days.  ?Save outside activities for late afternoon or after a heavy rain, when pollen levels are lower.  ?Avoid  mowing of grass if you have grass pollen allergy. ?Be aware that pollen can also be transported indoors on people and pets.  ?Dry your clothes in an automatic dryer rather than hanging them outside where they might collect pollen.  ?Rinse hair and eyes before bedtime. ? ?Pet Allergen Avoidance: ?Contrary to popular opinion, there are no ?hypoallergenic? breeds of dogs or cats. That is because people are not allergic to an animal?s hair, but to an allergen found in the animal's saliva, dander (dead skin flakes) or urine. Pet allergy symptoms typically occur within minutes. For some people, symptoms can build up and become most severe 8 to 12 hours after contact with the animal. People with severe allergies can experience reactions in public places if dander has been transported on the pet owners? clothing. ?Keeping an animal outdoors is only a partial solution, since homes with pets in the yard still have higher concentrations of animal allergens. ?Before getting a pet, ask your allergist to determine if you are allergic to animals. If your pet is already considered part of your family, try to minimize contact and keep the pet out of the bedroom and other rooms where you spend a great deal of time. ?As with dust mites, vacuum carpets often or replace carpet with a hardwood floor, tile or linoleum. ?High-efficiency particulate air (HEPA) cleaners can reduce allergen levels over time. ?While dander and saliva are the source of cat and dog allergens, urine is the source of allergens from rabbits, hamsters, mice and pigs; so ask a non-allergic family member to clean the animal?s cage. ?If you  have a pet allergy, talk to your allergist about the potential for allergy immunotherapy (allergy shots). This strategy can often provide long-term relief. ? ?

## 2021-10-16 NOTE — Assessment & Plan Note (Signed)
Past history - Covid-19 in December 2021. Triggers include exertion and increased pet dander. ?Interim history - Covid-19 in January 2023. Noted some wheezing in the mornings.  ?? Today's spirometry showed some moderate restriction. ?? Daily controller medication(s): START Aidruo 1 puff twice a day and rinse mouth after each use - this replaces Flovent and take for 2 months then go back to Flovent 1 puffs 1-2 times a day with spacer and rinse mouth afterwards. ?? Continue Singulair (montelukast) 10mg  daily at night. ?? May use albuterol rescue inhaler 2 puffs every 4 to 6 hours as needed for shortness of breath, chest tightness, coughing, and wheezing. May use albuterol rescue inhaler 2 puffs 5 to 15 minutes prior to strenuous physical activities. Monitor frequency of use.  ?? Get spirometry at next visit. ?

## 2021-10-16 NOTE — Assessment & Plan Note (Signed)
Some nasal congestion. Only using nasal spray prn.  ?? May use over the counter antihistamines such as Zyrtec (cetirizine) 10mg  daily as needed. May take twice a day if needed.  ?? Continue Singulair (montelukast) 10mg  daily at night. ?? Use Flonase (fluticasone) nasal spray 1 spray per nostril twice a day as needed for nasal congestion.  ?? Nasal saline spray (i.e., Simply Saline) or nasal saline lavage (i.e., NeilMed) is recommended as needed and prior to medicated nasal sprays. ?? Continue environmental control measures.  ?? If symptoms not controlled with above regimen then recommend retesting and starting allergy immunotherapy. ?

## 2022-01-16 ENCOUNTER — Ambulatory Visit: Payer: BC Managed Care – PPO | Admitting: Internal Medicine

## 2022-01-21 NOTE — Progress Notes (Signed)
FOLLOW UP Date of Service/Encounter:  01/23/22   Subjective:  Deanna Rubio (DOB: 1971-08-09) is a 51 y.o. female [facility] in re-evaluation of the following:  asthma, allergic rhinitis History obtained from: chart review and patient.  For Review, LV was on 10/16/21  with Dr. Selena Batten seen for routine follow-up.she was started on AirDuo 113 mcg 1 puff BID in place of Flovent 110, 2 puffs BID during URIs and spring season following COVID - 19 infection and increased morning wheezing.   Pertinent History/Diagnostics:  - Asthma: Mild persistent asthma Past history - Covid-19 in December 2021. Triggers include exertion and increased pet dander. Covid-19 in January 2023. Noted some wheezing in the mornings. - Allergic Rhinitis: Some nasal congestion. Only using nasal spray prn.   Today presents for follow-up. Asthma: She did much better on AirDuo, rarely needed her rescue inhaler.  She did go back on her Flovent as instructed by Dr. Selena Batten, but was needing her rescue inhaler more often.  She had no refills on her Flovent, so restarted her AirDuo.  She only use the Flovent for 2 weeks.  Once restarting her AirDuo, her rescue inhaler use has declined again and she has not needed it at all since restarting this medication except for on 1 occasion. Allergic rhinitis: Her allergies are overall controlled as long as she takes her medications.  She takes Zyrtec and Singulair.  Flonase as needed.  Allergies as of 01/23/2022   No Known Allergies      Medication List        Accurate as of January 23, 2022  1:35 PM. If you have any questions, ask your nurse or doctor.          STOP taking these medications    Flovent HFA 110 MCG/ACT inhaler Generic drug: fluticasone Stopped by: Verlee Monte, MD       TAKE these medications    AirDuo Digihaler 113-14 MCG/ACT Aepb Generic drug: Fluticasone-Salmeterol(sensor) Inhale 1 puff into the lungs in the  morning and at bedtime. Rinse mouth after each use.   albuterol 108 (90 Base) MCG/ACT inhaler Commonly known as: Ventolin HFA Inhale 2 puffs into the lungs every 4 (four) hours as needed for wheezing or shortness of breath (coughing fits).   cetirizine 10 MG tablet Commonly known as: ZYRTEC Take 1 tablet (10 mg total) by mouth daily as needed.   fluticasone 50 MCG/ACT nasal spray Commonly known as: FLONASE Place 1-2 sprays into both nostrils daily as needed for rhinitis.   montelukast 10 MG tablet Commonly known as: Singulair Take 1 tablet (10 mg total) by mouth at bedtime.   tetrahydrozoline-zinc 0.05-0.25 % ophthalmic solution Commonly known as: VISINE-AC Place 1 drop into both eyes 3 (three) times daily as needed.       Past Medical History:  Diagnosis Date   Asthma    History reviewed. No pertinent surgical history. Otherwise, there have been no changes to her past medical history, surgical history, family history, or social history.  ROS: All others negative except as noted per HPI.   Objective:  BP 128/80   Pulse 74   Temp 98.1 F (36.7 C) (Temporal)   Resp 18   SpO2 97%  There is no height or weight on file to calculate BMI. Physical Exam: General Appearance:  Alert, cooperative, no distress, appears stated age  Head:  Normocephalic, without obvious abnormality, atraumatic  Eyes:  Conjunctiva clear, EOM's intact  Nose:  Nares normal, hypertrophic turbinates, normal mucosa, and no visible anterior polyps  Throat: Lips, tongue normal; teeth and gums normal, normal posterior oropharynx  Neck: Supple, symmetrical  Lungs:   clear to auscultation bilaterally, Respirations unlabored, no coughing  Heart:  regular rate and rhythm and no murmur, Appears well perfused  Extremities: No edema  Skin: Skin color, texture, turgor normal, no rashes or lesions on visualized portions of skin  Neurologic: No gross deficits  Spirometry:  Tracings reviewed. Her effort: Good  reproducible efforts. FVC: 2.22L FEV1: 1.81L, 66% predicted FEV1/FVC ratio: 98% Interpretation: Spirometry consistent with possible restrictive disease.  Please see scanned spirometry results for details.  Assessment/Plan   Allergic rhinitis: Controlled May use over the counter antihistamines such as Zyrtec (cetirizine) 10mg  daily as needed. May take twice a day if needed.  Continue Singulair (montelukast) 10mg  daily at night. Use Flonase (fluticasone) nasal spray 1 spray per nostril twice a day as needed for nasal congestion.  Nasal saline spray (i.e., Simply Saline) or nasal saline lavage (i.e., NeilMed) is recommended as needed and prior to medicated nasal sprays. Continue environmental control measures.   Asthma: Controlled on air duo Daily controller medication(s): continue AirDuo 113 mcg 1 puffs 2 times a day and rinse mouth afterwards. Let know if you have issues with coverage Continue Singulair (montelukast) 10mg  daily at night. May use albuterol rescue inhaler 2 puffs every 4 to 6 hours as needed for shortness of breath, chest tightness, coughing, and wheezing. May use albuterol rescue inhaler 2 puffs 5 to 15 minutes prior to strenuous physical activities. Monitor frequency of use.  Asthma control goals:  Full participation in all desired activities (may need albuterol before activity) Albuterol use two times or less a week on average (not counting use with activity) Cough interfering with sleep two times or less a month Oral steroids no more than once a year No hospitalizations  Follow up in 3 months or sooner if needed.  , MD  [facility]

## 2022-01-23 ENCOUNTER — Ambulatory Visit: Payer: BC Managed Care – PPO | Admitting: Internal Medicine

## 2022-01-23 ENCOUNTER — Telehealth: Payer: Self-pay

## 2022-01-23 ENCOUNTER — Encounter: Payer: Self-pay | Admitting: Internal Medicine

## 2022-01-23 VITALS — BP 128/80 | HR 74 | Temp 98.1°F | Resp 18

## 2022-01-23 DIAGNOSIS — J4540 Moderate persistent asthma, uncomplicated: Secondary | ICD-10-CM | POA: Diagnosis not present

## 2022-01-23 MED ORDER — AIRDUO DIGIHALER 113-14 MCG/ACT IN AEPB: 1.0000 | INHALATION_SPRAY | Freq: Two times a day (BID) | RESPIRATORY_TRACT | 3 refills | Status: DC

## 2022-01-23 NOTE — Patient Instructions (Addendum)
Allergic rhinitis: May use over the counter antihistamines such as Zyrtec (cetirizine) 10mg  daily as needed. May take twice a day if needed.  Continue Singulair (montelukast) 10mg  daily at night. Use Flonase (fluticasone) nasal spray 1 spray per nostril twice a day as needed for nasal congestion.  Nasal saline spray (i.e., Simply Saline) or nasal saline lavage (i.e., NeilMed) is recommended as needed and prior to medicated nasal sprays. Continue environmental control measures.   Asthma: Daily controller medication(s): continue AirDuo 113 mcg 1 puffs 2 times a day and rinse mouth afterwards. Let know if you have issues with coverage Continue Singulair (montelukast) 10mg  daily at night. May use albuterol rescue inhaler 2 puffs every 4 to 6 hours as needed for shortness of breath, chest tightness, coughing, and wheezing. May use albuterol rescue inhaler 2 puffs 5 to 15 minutes prior to strenuous physical activities. Monitor frequency of use.  Asthma control goals:  Full participation in all desired activities (may need albuterol before activity) Albuterol use two times or less a week on average (not counting use with activity) Cough interfering with sleep two times or less a month Oral steroids no more than once a year No hospitalizations  Follow up in 3 months or sooner if needed. It was so nice meeting you today!  Good luck this summer and I wish your daughter's the best in competition! , MD [facility]   Reducing Pollen Exposure Pollen seasons: trees (spring), grass (summer) and ragweed/weeds (fall). Keep windows closed in your home and car to lower pollen exposure.  Install air conditioning in the bedroom and throughout the house if possible.  Avoid going out in dry windy days - especially early morning. Pollen counts are highest between 5 - 10 AM and on dry, hot and windy days.  Save outside activities for late afternoon or after a heavy rain, when  pollen levels are lower.  Avoid mowing of grass if you have grass pollen allergy. Be aware that pollen can also be transported indoors on people and pets.  Dry your clothes in an automatic dryer rather than hanging them outside where they might collect pollen.  Rinse hair and eyes before bedtime.  Pet Allergen Avoidance: Contrary to popular opinion, there are no "hypoallergenic" breeds of dogs or cats. That is because people are not allergic to an animal's hair, but to an allergen found in the animal's saliva, dander (dead skin flakes) or urine. Pet allergy symptoms typically occur within minutes. For some people, symptoms can build up and become most severe 8 to 12 hours after contact with the animal. People with severe allergies can experience reactions in public places if dander has been transported on the pet owners' clothing. Keeping an animal outdoors is only a partial solution, since homes with pets in the yard still have higher concentrations of animal allergens. Before getting a pet, ask your allergist to determine if you are allergic to animals. If your pet is already considered part of your family, try to minimize contact and keep the pet out of the bedroom and other rooms where you spend a great deal of time. As with dust mites, vacuum carpets often or replace carpet with a hardwood floor, tile or linoleum. High-efficiency particulate air (HEPA) cleaners can reduce allergen levels over time. While dander and saliva are the source of cat and dog allergens, urine is the source of allergens from rabbits, hamsters, mice and [place] pigs; so ask a non-allergic family member to  clean the animal's cage. If you have a pet allergy, talk to your allergist about the potential for allergy immunotherapy (allergy shots). This strategy can often provide long-term relief.

## 2022-01-23 NOTE — Telephone Encounter (Signed)
Pa submited thru cover my meds for airduo waiting on results from insurance key [phone]

## 2022-01-27 ENCOUNTER — Other Ambulatory Visit: Payer: Self-pay | Admitting: Allergy

## 2022-03-03 ENCOUNTER — Other Ambulatory Visit: Payer: Self-pay

## 2022-03-03 MED ORDER — AIRDUO DIGIHALER 113-14 MCG/ACT IN AEPB: 1.0000 | INHALATION_SPRAY | Freq: Two times a day (BID) | RESPIRATORY_TRACT | 2 refills | Status: DC

## 2022-03-03 NOTE — Telephone Encounter (Signed)
Sent in a new rx for airduo digihaler 113-14 mcg sent into [facility].

## 2022-04-29 ENCOUNTER — Other Ambulatory Visit: Payer: Self-pay | Admitting: Internal Medicine

## 2022-04-30 ENCOUNTER — Telehealth: Payer: Self-pay

## 2022-04-30 NOTE — Telephone Encounter (Signed)
Pa submitted thru cover my meds for airduo waiting on response form insurance key [id number]

## 2022-05-01 NOTE — Telephone Encounter (Signed)
Pa approved pharmacy notified

## 2022-10-17 NOTE — Progress Notes (Unsigned)
FOLLOW UP Date of Service/Encounter:  10/20/22   Subjective:  Deanna Rubio (DOB: 09-20-1970) is a 52 y.o. female who returns to the [facility] [place] on 10/20/2022 in re-evaluation of the following: asthma, allergic rhinitis  History obtained from: chart review and patient.  For Review, LV was on 01/23/22  with Dr.Taheerah Guldin seen for routine follow-up. Doing great on AirDuo. Allergies controlled on zyrtec and singulair, flonase as needed. FEV1 66%.  Pertinent History/Diagnostics:  Mild persistent asthma Past history - Covid-19 in December 2021. Triggers include exertion and increased pet dander. Covid-19 in January 2023. Noted some wheezing in the mornings. Allergic Rhinitis:  Some nasal congestion. Only using nasal spray prn.  She has never done AIT.    Today presents for follow-up. She is doing well on her AirDuo. She has only used her rescue inhaler a few times per month.  She has not needed any steroids of antibiotics since our last visit.  She is doing well overall, but does feel nasally today.  However was painting her home yesterday. She doesn't feel flonase helps at all.  She is taking singulair daily.  She also takes Zyrtec daily as needed.   Allergies as of 10/20/2022   No Known Allergies      Medication List        Accurate as of October 20, 2022  1:15 PM. If you have any questions, ask your nurse or doctor.          AirDuo Digihaler 113-14 MCG/ACT Aepb Generic drug: Fluticasone-Salmeterol(sensor) INHALE 1 PUFF INTO THE LUNGS IN THE MORNING AND AT BEDTIME. RINSE MOUTH AFTER EACH USE.   albuterol 108 (90 Base) MCG/ACT inhaler Commonly known as: Ventolin HFA Inhale 2 puffs into the lungs every 4 (four) hours as needed for wheezing or shortness of breath (coughing fits).   cetirizine 10 MG tablet Commonly known as: ZYRTEC Take 1 tablet (10 mg total) by mouth daily as needed.   fluticasone 50 MCG/ACT nasal spray Commonly known as: FLONASE Place 1-2  sprays into both nostrils daily as needed for rhinitis.   montelukast 10 MG tablet Commonly known as: Singulair Take 1 tablet (10 mg total) by mouth at bedtime.   tetrahydrozoline-zinc 0.05-0.25 % ophthalmic solution Commonly known as: VISINE-AC Place 1 drop into both eyes 3 (three) times daily as needed.       Past Medical History:  Diagnosis Date   Asthma    No past surgical history on file. Otherwise, there have been no changes to her past medical history, surgical history, family history, or social history.  ROS: All others negative except as noted per HPI.   Objective:  BP 106/64   Pulse 83   Temp 97.7 F (36.5 C) (Temporal)   Resp 16   SpO2 95%  There is no height or weight on file to calculate BMI. Physical Exam: General Appearance:  Alert, cooperative, no distress, appears stated age  Head:  Normocephalic, without obvious abnormality, atraumatic  Eyes:  Conjunctiva clear, EOM's intact  Nose: Nares normal, hypertrophic turbinates, normal mucosa, and no visible anterior polyps  Throat: Lips, tongue normal; teeth and gums normal, normal posterior oropharynx  Neck: Supple, symmetrical  Lungs:   clear to auscultation bilaterally, Respirations unlabored, no coughing  Heart:  regular rate and rhythm and no murmur, Appears well perfused  Extremities: No edema  Skin: Skin color, texture, turgor normal, no rashes or lesions on visualized portions of skin  Neurologic: No gross deficits   Spirometry:  Tracings reviewed. Her effort: Good reproducible efforts. FVC: 2.45L FEV1: 1.93L, 70% predicted FEV1/FVC ratio: 99% Interpretation:  Nonobstructive pattern, low FEV1 Please see scanned spirometry results for details.   Assessment/Plan   Allergic rhinitis: controlled May use over the counter antihistamines such as Zyrtec (cetirizine) '10mg'$  daily as needed. May take twice a day if needed.  Continue Singulair (montelukast) '10mg'$  daily at night. Use Dymista 1 spray up to  twice daily as needed. This will replace flovent. Nasal saline spray (i.e., Simply Saline) or nasal saline lavage (i.e., NeilMed) is recommended as needed and prior to medicated nasal sprays. Continue environmental control measures.   Asthma - controlled Daily controller medication(s): continue AirDuo 113 mcg 1 puffs 2 times a day and rinse mouth afterwards. Let us know if you have issues with coverage Continue Singulair (montelukast) '10mg'$  daily at night. May use AirSupra rescue inhaler as needed for shortness of breath, chest tightness, coughing, and wheezing. May use albuterol rescue inhaler 2 puffs 5 to 15 minutes prior to strenuous physical activities. Monitor frequency of use. Maximum 12 puffs/day. This will replace albuterol Asthma control goals:  Full participation in all desired activities (may need albuterol before activity) Albuterol use two times or less a week on average (not counting use with activity) Cough interfering with sleep two times or less a month Oral steroids no more than once a year No hospitalizations  Follow up in 6 months or sooner if needed.  Sigurd Sos, MD  [facility]

## 2022-10-20 ENCOUNTER — Ambulatory Visit: Payer: BC Managed Care – PPO | Admitting: Internal Medicine

## 2022-10-20 ENCOUNTER — Encounter: Payer: Self-pay | Admitting: Internal Medicine

## 2022-10-20 VITALS — BP 106/64 | HR 83 | Temp 97.7°F | Resp 16

## 2022-10-20 DIAGNOSIS — J3089 Other allergic rhinitis: Secondary | ICD-10-CM

## 2022-10-20 DIAGNOSIS — J4530 Mild persistent asthma, uncomplicated: Secondary | ICD-10-CM

## 2022-10-20 MED ORDER — AIRDUO DIGIHALER 113-14 MCG/ACT IN AEPB: 1.0000 | INHALATION_SPRAY | Freq: Two times a day (BID) | RESPIRATORY_TRACT | 5 refills | Status: DC

## 2022-10-20 MED ORDER — CETIRIZINE HCL 10 MG PO TABS: 10.0000 mg | ORAL_TABLET | Freq: Every day | ORAL | 3 refills | Status: DC | PRN

## 2022-10-20 MED ORDER — MONTELUKAST SODIUM 10 MG PO TABS
10.0000 mg | ORAL_TABLET | Freq: Every day | ORAL | 3 refills | Status: DC
Start: 2022-10-20 — End: 2023-04-23

## 2022-10-20 MED ORDER — AZELASTINE-FLUTICASONE 137-50 MCG/ACT NA SUSP: 1.0000 | Freq: Two times a day (BID) | NASAL | 5 refills | Status: DC | PRN

## 2022-10-20 MED ORDER — AIRSUPRA 90-80 MCG/ACT IN AERO: 2.0000 | INHALATION_SPRAY | RESPIRATORY_TRACT | 3 refills | Status: DC | PRN

## 2022-10-20 MED ORDER — ALBUTEROL SULFATE HFA 108 (90 BASE) MCG/ACT IN AERS: 2.0000 | INHALATION_SPRAY | RESPIRATORY_TRACT | 1 refills | Status: DC | PRN

## 2022-10-20 NOTE — Patient Instructions (Addendum)
Allergic rhinitis:  May use over the counter antihistamines such as Zyrtec (cetirizine) '10mg'$  daily as needed. May take twice a day if needed.  Continue Singulair (montelukast) '10mg'$  daily at night. Use Dymista 1 spray up to twice daily as needed. This will replace flovent. Nasal saline spray (i.e., Simply Saline) or nasal saline lavage (i.e., NeilMed) is recommended as needed and prior to medicated nasal sprays. Continue environmental control measures.   Asthma: Daily controller medication(s): continue AirDuo 113 mcg 1 puffs 2 times a day and rinse mouth afterwards. Let us know if you have issues with coverage Continue Singulair (montelukast) '10mg'$  daily at night. May use AirSupra rescue inhaler as needed for shortness of breath, chest tightness, coughing, and wheezing. May use albuterol rescue inhaler 2 puffs 5 to 15 minutes prior to strenuous physical activities. Monitor frequency of use. Maximum 12 puffs/day. This will replace albuterol Asthma control goals:  Full participation in all desired activities (may need albuterol before activity) Albuterol use two times or less a week on average (not counting use with activity) Cough interfering with sleep two times or less a month Oral steroids no more than once a year No hospitalizations  Follow up in 6 months or sooner if needed.   Sigurd Sos, MD [facility]   Reducing Pollen Exposure Pollen seasons: trees (spring), grass (summer) and ragweed/weeds (fall). Keep windows closed in your home and car to lower pollen exposure.  Install air conditioning in the bedroom and throughout the house if possible.  Avoid going out in dry windy days - especially early morning. Pollen counts are highest between 5 - 10 AM and on dry, hot and windy days.  Save outside activities for late afternoon or after a heavy rain, when pollen levels are lower.  Avoid mowing of grass if you have grass pollen allergy. Be aware that pollen can also  be transported indoors on people and pets.  Dry your clothes in an automatic dryer rather than hanging them outside where they might collect pollen.  Rinse hair and eyes before bedtime.  Pet Allergen Avoidance: Contrary to popular opinion, there are no "hypoallergenic" breeds of dogs or cats. That is because people are not allergic to an animal's hair, but to an allergen found in the animal's saliva, dander (dead skin flakes) or urine. Pet allergy symptoms typically occur within minutes. For some people, symptoms can build up and become most severe 8 to 12 hours after contact with the animal. People with severe allergies can experience reactions in public places if dander has been transported on the pet owners' clothing. Keeping an animal outdoors is only a partial solution, since homes with pets in the yard still have higher concentrations of animal allergens. Before getting a pet, ask your allergist to determine if you are allergic to animals. If your pet is already considered part of your family, try to minimize contact and keep the pet out of the bedroom and other rooms where you spend a great deal of time. As with dust mites, vacuum carpets often or replace carpet with a hardwood floor, tile or linoleum. High-efficiency particulate air (HEPA) cleaners can reduce allergen levels over time. While dander and saliva are the source of cat and dog allergens, urine is the source of allergens from rabbits, hamsters, mice and [place] pigs; so ask a non-allergic family member to clean the animal's cage. If you have a pet allergy, talk to your allergist about the potential for allergy immunotherapy (allergy shots). This  strategy can often provide long-term relief.

## 2022-10-22 ENCOUNTER — Other Ambulatory Visit (HOSPITAL_COMMUNITY): Payer: Self-pay

## 2022-10-23 ENCOUNTER — Other Ambulatory Visit (HOSPITAL_COMMUNITY): Payer: Self-pay

## 2022-10-24 ENCOUNTER — Other Ambulatory Visit (HOSPITAL_COMMUNITY): Payer: Self-pay

## 2022-11-07 ENCOUNTER — Other Ambulatory Visit (HOSPITAL_COMMUNITY): Payer: Self-pay

## 2022-12-19 ENCOUNTER — Telehealth: Payer: Self-pay | Admitting: Internal Medicine

## 2022-12-19 MED ORDER — FLUTICASONE FUROATE-VILANTEROL 100-25 MCG/ACT IN AEPB: 1.0000 | INHALATION_SPRAY | Freq: Every day | RESPIRATORY_TRACT | 5 refills | Status: DC

## 2022-12-19 MED ORDER — FLUTICASONE FUROATE-VILANTEROL 200-25 MCG/ACT IN AEPB: 1.0000 | INHALATION_SPRAY | Freq: Every day | RESPIRATORY_TRACT | 5 refills | Status: DC

## 2022-12-19 NOTE — Telephone Encounter (Signed)
Informed pt of the change and verified pharmacy

## 2022-12-19 NOTE — Telephone Encounter (Signed)
Patient states she is having trouble fill her prescription for Air- Duo, several pharmacies do not have it. She is asking for helping getting this medication or an alternative, she has been out of it for a week.

## 2022-12-19 NOTE — Telephone Encounter (Signed)
Breo 200 1 puff daily. Thanks!

## 2022-12-19 NOTE — Addendum Note (Signed)
Addended by: Berna Bue on: 12/19/2022 01:19 PM   Modules accepted: Orders

## 2022-12-19 NOTE — Telephone Encounter (Signed)
Breo and advair seem to be preferred. Which would you like to change too?

## 2023-04-23 ENCOUNTER — Ambulatory Visit: Payer: BC Managed Care – PPO | Admitting: Internal Medicine

## 2023-04-23 ENCOUNTER — Encounter: Payer: Self-pay | Admitting: Internal Medicine

## 2023-04-23 VITALS — BP 100/78 | HR 76 | Temp 97.7°F | Resp 20 | Ht 64.17 in | Wt 207.8 lb

## 2023-04-23 DIAGNOSIS — J4530 Mild persistent asthma, uncomplicated: Secondary | ICD-10-CM

## 2023-04-23 DIAGNOSIS — J3089 Other allergic rhinitis: Secondary | ICD-10-CM

## 2023-04-23 MED ORDER — AIRSUPRA 90-80 MCG/ACT IN AERO: 2.0000 | INHALATION_SPRAY | RESPIRATORY_TRACT | 3 refills | Status: DC | PRN

## 2023-04-23 MED ORDER — MONTELUKAST SODIUM 10 MG PO TABS: 10.0000 mg | ORAL_TABLET | Freq: Every day | ORAL | 3 refills | Status: DC

## 2023-04-23 MED ORDER — BREO ELLIPTA 100-25 MCG/ACT IN AEPB: 1.0000 | INHALATION_SPRAY | Freq: Every day | RESPIRATORY_TRACT | 5 refills | Status: DC

## 2023-04-23 NOTE — Progress Notes (Signed)
FOLLOW UP Date of Service/Encounter:  04/23/23  Subjective:  Deanna Rubio (DOB: 02-24-1971) is a 52 y.o. female who [facility] in re-evaluation of the following:  asthma, allergic rhinitis  History obtained from: chart review and patient.  For Review, LV was on 10/20/22  with Dr.Aryianna Earwood seen for routine follow-up. See below for summary of history and diagnostics.   Therapeutic plans/changes recommended: Fev1 70%, we switched her to dymista, continued singulair and zyrtec. Airduo 113 mcg 1 puff BID. Switched to [employer] rescue. ----------------------------------------------------- Pertinent History/Diagnostics:  Mild persistent asthma Past history - Covid-19 in December 2021. Triggers include exertion and increased pet dander. Covid-19 in January 2023. Noted some wheezing in the mornings. Current meds: Breo 100 mcg 1 puff daily, airsupra PRN Allergic Rhinitis:  Some nasal congestion. Only using nasal spray prn.  She has never done AIT. Current meds: dymista, singulair, zyrtec 10 mg daily PRN --------------------------------------------------- Today presents for follow-up. She has switched to Breo 100 mcg and she likes it.  She was unable to get the AirDuo anymore.  She is getting Paulene Floor covered, but it is a delay when she picks it up due to supply issues at the pharmacy.  She likes the [place].  She has only used albuterol once since her last visit.  She prefers the Airsupra-estimates using less than once per week.   She was able to get the dymista nasal spray, but she doesn't use it often. She plans on using more frequently now that fall is coming.  She is still on singulair and zyrtec daily. She has not needed steroids or antibiotics since last visit.    All medications reviewed by clinical staff and updated in chart. No new pertinent medical or surgical history except as noted in HPI.  ROS: All others negative except as noted per HPI.    Objective:  BP 100/78 (BP Location: Right Arm, Patient Position: Sitting, Cuff Size: Normal)   Pulse 76   Temp 97.7 F (36.5 C) (Temporal)   Resp 20   Ht 5' 4.17" (1.63 m)   Wt 207 lb 12.8 oz (94.3 kg)   SpO2 97%   BMI 35.48 kg/m  Body mass index is 35.48 kg/m. Physical Exam: General Appearance:  Alert, cooperative, no distress, appears stated age  Head:  Normocephalic, without obvious abnormality, atraumatic  Eyes:  Conjunctiva clear, EOM's intact  Ears EACs normal bilaterally and normal TMs bilaterally  Nose: Nares normal, hypertrophic turbinates, normal mucosa, and no visible anterior polyps  Throat: Lips, tongue normal; teeth and gums normal, normal posterior oropharynx  Neck: Supple, symmetrical  Lungs:   clear to auscultation bilaterally, Respirations unlabored, no coughing  Heart:  regular rate and rhythm and no murmur, Appears well perfused  Extremities: No edema  Skin: Skin color, texture, turgor normal and no rashes or lesions on visualized portions of skin  Neurologic: No gross deficits   Labs:  Lab Orders  No laboratory test(s) ordered today    Spirometry:  Tracings reviewed. Her effort: Good reproducible efforts. FVC: 2.49L FEV1: 2.01L, 73% predicted FEV1/FVC ratio: 0.81 Interpretation: Nonobstructive ratio, low FEV1, possible restriction.  Please see scanned spirometry results for details.  Assessment/Plan   Allergic rhinitis: at goal May use over the counter antihistamines such as Zyrtec (cetirizine) 10mg  daily as needed. May take twice a day if needed.  Continue Singulair (montelukast) 10mg  daily at night. Use Dymista 1 spray up to twice daily as needed.  Nasal saline spray (i.e.,  Simply Saline) or nasal saline lavage (i.e., NeilMed) is recommended as needed and prior to medicated nasal sprays. Continue environmental control measures.   Asthma: at goal Daily controller medication(s): continue Breo 100 mcg 1 puffs daily and rinse mouth afterwards.  Let us know if you have issues with coverage Continue Singulair (montelukast) 10mg  daily at night. May use AirSupra rescue inhaler as needed for shortness of breath, chest tightness, coughing, and wheezing. May use albuterol rescue inhaler 2 puffs 5 to 15 minutes prior to strenuous physical activities. Monitor frequency of use. Maximum 12 puffs/day. This will replace albuterol Asthma control goals:  Full participation in all desired activities (may need albuterol before activity) Albuterol use two times or less a week on average (not counting use with activity) Cough interfering with sleep two times or less a month Oral steroids no more than once a year No hospitalizations  Follow up in 6 months or sooner if needed. Tonny Bollman, MD [facility]  Other: none  Tonny Bollman, MD  [facility]

## 2023-04-23 NOTE — Patient Instructions (Addendum)
Allergic rhinitis:  May use over the counter antihistamines such as Zyrtec (cetirizine) 10mg  daily as needed. May take twice a day if needed.  Continue Singulair (montelukast) 10mg  daily at night. Use Dymista 1 spray up to twice daily as needed.  Nasal saline spray (i.e., Simply Saline) or nasal saline lavage (i.e., NeilMed) is recommended as needed and prior to medicated nasal sprays. Continue environmental control measures.   Asthma: Daily controller medication(s): continue Breo 100 mcg 1 puffs daily and rinse mouth afterwards. Let us know if you have issues with coverage Continue Singulair (montelukast) 10mg  daily at night. May use AirSupra rescue inhaler as needed for shortness of breath, chest tightness, coughing, and wheezing. May use albuterol rescue inhaler 2 puffs 5 to 15 minutes prior to strenuous physical activities. Monitor frequency of use. Maximum 12 puffs/day. This will replace albuterol Asthma control goals:  Full participation in all desired activities (may need albuterol before activity) Albuterol use two times or less a week on average (not counting use with activity) Cough interfering with sleep two times or less a month Oral steroids no more than once a year No hospitalizations  Follow up in 6 months or sooner if needed. Tonny Bollman, MD [facility]   Reducing Pollen Exposure Pollen seasons: trees (spring), grass (summer) and ragweed/weeds (fall). Keep windows closed in your home and car to lower pollen exposure.  Install air conditioning in the bedroom and throughout the house if possible.  Avoid going out in dry windy days - especially early morning. Pollen counts are highest between 5 - 10 AM and on dry, hot and windy days.  Save outside activities for late afternoon or after a heavy rain, when pollen levels are lower.  Avoid mowing of grass if you have grass pollen allergy. Be aware that pollen can also be transported indoors on people and  pets.  Dry your clothes in an automatic dryer rather than hanging them outside where they might collect pollen.  Rinse hair and eyes before bedtime.  Pet Allergen Avoidance: Contrary to popular opinion, there are no "hypoallergenic" breeds of dogs or cats. That is because people are not allergic to an animal's hair, but to an allergen found in the animal's saliva, dander (dead skin flakes) or urine. Pet allergy symptoms typically occur within minutes. For some people, symptoms can build up and become most severe 8 to 12 hours after contact with the animal. People with severe allergies can experience reactions in public places if dander has been transported on the pet owners' clothing. Keeping an animal outdoors is only a partial solution, since homes with pets in the yard still have higher concentrations of animal allergens. Before getting a pet, ask your allergist to determine if you are allergic to animals. If your pet is already considered part of your family, try to minimize contact and keep the pet out of the bedroom and other rooms where you spend a great deal of time. As with dust mites, vacuum carpets often or replace carpet with a hardwood floor, tile or linoleum. High-efficiency particulate air (HEPA) cleaners can reduce allergen levels over time. While dander and saliva are the source of cat and dog allergens, urine is the source of allergens from rabbits, hamsters, mice and [place] pigs; so ask a non-allergic family member to clean the animal's cage. If you have a pet allergy, talk to your allergist about the potential for allergy immunotherapy (allergy shots). This strategy can often provide long-term relief.

## 2023-10-23 ENCOUNTER — Other Ambulatory Visit: Payer: Self-pay | Admitting: Internal Medicine

## 2023-10-29 ENCOUNTER — Ambulatory Visit: Payer: BC Managed Care – PPO | Admitting: Internal Medicine

## 2023-10-29 ENCOUNTER — Encounter: Payer: Self-pay | Admitting: Internal Medicine

## 2023-10-29 VITALS — BP 110/82 | HR 76 | Resp 17 | Wt 207.3 lb

## 2023-10-29 DIAGNOSIS — J4540 Moderate persistent asthma, uncomplicated: Secondary | ICD-10-CM

## 2023-10-29 DIAGNOSIS — J3089 Other allergic rhinitis: Secondary | ICD-10-CM | POA: Diagnosis not present

## 2023-10-29 DIAGNOSIS — M791 Myalgia, unspecified site: Secondary | ICD-10-CM | POA: Diagnosis not present

## 2023-10-29 MED ORDER — MONTELUKAST SODIUM 10 MG PO TABS: 10.0000 mg | ORAL_TABLET | Freq: Every day | ORAL | 3 refills | Status: AC

## 2023-10-29 MED ORDER — AZELASTINE-FLUTICASONE 137-50 MCG/ACT NA SUSP: 1.0000 | Freq: Two times a day (BID) | NASAL | 5 refills | Status: AC | PRN

## 2023-10-29 MED ORDER — SYMBICORT 80-4.5 MCG/ACT IN AERO: 2.0000 | INHALATION_SPRAY | Freq: Two times a day (BID) | RESPIRATORY_TRACT | 12 refills | Status: AC

## 2023-10-29 NOTE — Patient Instructions (Addendum)
 Allergic rhinitis:  May use over the counter antihistamines such as Zyrtec (cetirizine) 10mg  daily as needed. May take twice a day if needed.  Continue Singulair (montelukast) 10mg  daily at night. Use Dymista 1 spray up to twice daily as needed.  Nasal saline spray (i.e., Simply Saline) or nasal saline lavage (i.e., NeilMed) is recommended as needed and prior to medicated nasal sprays. Continue environmental control measures.   Asthma: Daily controller medication(s): start symbicort 2 puffs twice daily with spacer  Continue Singulair (montelukast) 10mg  daily at night. May use AirSupra rescue inhaler as needed for shortness of breath, chest tightness, coughing, and wheezing. May use albuterol rescue inhaler 2 puffs 5 to 15 minutes prior to strenuous physical activities. Monitor frequency of use. Maximum 12 puffs/day. Asthma control goals:  Full participation in all desired activities (may need albuterol before activity) Albuterol use two times or less a week on average (not counting use with activity) Cough interfering with sleep two times or less a month Oral steroids no more than once a year No hospitalizations   Discuss joint and muscle pains with PCP.   Follow up: 6 months   Thank you so much for letting me partake in your care today.  Don't hesitate to reach out if you have any additional concerns!  Ferol Luz, MD  [facility] [place] [place]

## 2023-10-29 NOTE — Progress Notes (Signed)
 FOLLOW UP Date of Service/Encounter:  10/29/23  Subjective:  Deanna Rubio (DOB: [birth year]) is a 53 y.o. female who [facility] in re-evaluation of the following:  asthma, allergic rhinitis  History obtained from: chart review and patient.  For Review, LV was on 04/23/23 with Dr.Dennis seen for routine follow-up. See below for summary of history and diagnostics.   Therapeutic plans/changes recommended: Fev1 70%, continued on Breo., singulair, dymista ----------------------------------------------------- Pertinent History/Diagnostics:  Mild persistent asthma Past history - Covid-19 in December 2021. Triggers include exertion and increased pet dander. Covid-19 in January 2023. Noted some wheezing in the mornings. Current meds: Breo 100 mcg 1 puff daily, airsupra PRN Allergic Rhinitis:  Some nasal congestion. Only using nasal spray prn.  She has never done AIT. Current meds: dymista, singulair, zyrtec 10 mg daily PRN --------------------------------------------------- Today presents for follow-up.  She experiences muscle pain and spasms primarily in the upper back and chest, which began around February.  No shortness of breath or other symptoms accompany the pain.  She has been using Breo for over a year, having switched from [employer]. She stopped using Breo for a period, which seemed to alleviate the pain, but upon resuming it recently, she noticed an increase in pain. She looked up online that muscle pain is a potential side effect of Breo.  She started noticing the muscle pain more after taking a high dose of vitamin D, prescribed as 50,000 units weekly, which she took for four weeks before stopping. After discontinuing the vitamin D, the pain subsided somewhat but recurs occasionally. She has also considered posture as a potential factor and has made some changes to address this.  She does feel like her asthma has been better controlled on  the breo.  Rare use of albuterol.    Rhinitis is well controlled with zyrtec, singulair, and dymista.    She has not been sick with COVID, flu, or RSV recently and has not experienced any significant illnesses for a long time. She has also reduced her caffeine intake, substituting water for tea, but continues to have coffee in the morning.   All medications reviewed by clinical staff and updated in chart. No new pertinent medical or surgical history except as noted in HPI.  ROS: All others negative except as noted per HPI.   Objective:  BP 110/82   Pulse 76   Resp 17   Wt 207 lb 4.8 oz (94 kg)   SpO2 96%   BMI 35.39 kg/m  Body mass index is 35.39 kg/m. Physical Exam: General Appearance:  Alert, cooperative, no distress, appears stated age  Head:  Normocephalic, without obvious abnormality, atraumatic  Eyes:  Conjunctiva clear, EOM's intact  Ears EACs normal bilaterally and normal TMs bilaterally  Nose: Nares normal, hypertrophic turbinates, normal mucosa, and no visible anterior polyps  Throat: Lips, tongue normal; teeth and gums normal, normal posterior oropharynx  Neck: Supple, symmetrical  Lungs:   clear to auscultation bilaterally, Respirations unlabored, no coughing  Heart:  regular rate and rhythm and no murmur, Appears well perfused  Extremities: No edema  Skin: Skin color, texture, turgor normal and no rashes or lesions on visualized portions of skin  Neurologic: No gross deficits   Labs:  Lab Orders  No laboratory test(s) ordered today    Spirometry:  Tracings reviewed. Her effort: Good reproducible efforts. FVC: 2.41L FEV1: 1.91L, 70% predicted FEV1/FVC ratio: 0.79 Interpretation: Nonobstructive ratio, low FEV1, possible restriction.  Please see scanned  spirometry results for details.  Assessment/Plan   Allergic rhinitis:  May use over the counter antihistamines such as Zyrtec (cetirizine) 10mg  daily as needed. May take twice a day if needed.  Continue  Singulair (montelukast) 10mg  daily at night. Use Dymista 1 spray up to twice daily as needed.  Nasal saline spray (i.e., Simply Saline) or nasal saline lavage (i.e., NeilMed) is recommended as needed and prior to medicated nasal sprays. Continue environmental control measures.   Asthma: Daily controller medication(s): start symbicort 2 puffs twice daily with spacer  Continue Singulair (montelukast) 10mg  daily at night. May use AirSupra rescue inhaler as needed for shortness of breath, chest tightness, coughing, and wheezing. May use albuterol rescue inhaler 2 puffs 5 to 15 minutes prior to strenuous physical activities. Monitor frequency of use. Maximum 12 puffs/day. Asthma control goals:  Full participation in all desired activities (may need albuterol before activity) Albuterol use two times or less a week on average (not counting use with activity) Cough interfering with sleep two times or less a month Oral steroids no more than once a year No hospitalizations   Discuss joint and muscle pains with PCP.   Follow up: 6 months   Thank you so much for letting me partake in your care today.  Don't hesitate to reach out if you have any additional concerns!  Ferol Luz, MD  [facility] [place]

## 2024-01-23 ENCOUNTER — Other Ambulatory Visit: Payer: Self-pay | Admitting: Internal Medicine

## 2024-02-05 ENCOUNTER — Other Ambulatory Visit: Payer: Self-pay | Admitting: Internal Medicine

## 2024-02-08 NOTE — Telephone Encounter (Signed)
 Called patient to see why she wants Ventolin  HFA. Dr. Marinda prescribed Airsupra  at last OV. Requested a call back to office.

## 2024-04-18 ENCOUNTER — Other Ambulatory Visit: Payer: Self-pay | Admitting: Internal Medicine

## 2024-09-01 ENCOUNTER — Other Ambulatory Visit: Payer: Self-pay | Admitting: Internal Medicine
